# Patient Record
Sex: Male | Born: 1935 | Race: White | Hispanic: No | Marital: Married | Smoking: Former smoker
Health system: Southern US, Community
[De-identification: ages and names within clinical notes are randomized; demographics above are authoritative.]

## PROBLEM LIST (undated history)

## (undated) DIAGNOSIS — E119 Type 2 diabetes mellitus without complications: Secondary | ICD-10-CM

## (undated) HISTORY — DX: Type 2 diabetes mellitus without complications: E11.9

---

## 2003-10-10 ENCOUNTER — Encounter: Admission: RE | Admit: 2003-10-10 | Discharge: 2003-10-10 | Payer: Self-pay | Admitting: Family Medicine

## 2003-11-06 ENCOUNTER — Ambulatory Visit (HOSPITAL_COMMUNITY): Admission: RE | Admit: 2003-11-06 | Discharge: 2003-11-06 | Payer: Self-pay | Admitting: Gastroenterology

## 2005-12-22 ENCOUNTER — Encounter: Admission: RE | Admit: 2005-12-22 | Discharge: 2005-12-22 | Payer: Self-pay | Admitting: Family Medicine

## 2005-12-29 ENCOUNTER — Encounter: Admission: RE | Admit: 2005-12-29 | Discharge: 2005-12-29 | Payer: Self-pay | Admitting: Family Medicine

## 2006-01-16 ENCOUNTER — Encounter: Admission: RE | Admit: 2006-01-16 | Discharge: 2006-01-16 | Payer: Self-pay | Admitting: Family Medicine

## 2006-01-31 ENCOUNTER — Ambulatory Visit (HOSPITAL_COMMUNITY): Admission: RE | Admit: 2006-01-31 | Discharge: 2006-02-01 | Payer: Self-pay | Admitting: *Deleted

## 2006-03-01 ENCOUNTER — Ambulatory Visit (HOSPITAL_COMMUNITY): Admission: RE | Admit: 2006-03-01 | Discharge: 2006-03-02 | Payer: Self-pay | Admitting: Interventional Cardiology

## 2006-04-13 ENCOUNTER — Encounter (HOSPITAL_COMMUNITY): Admission: RE | Admit: 2006-04-13 | Discharge: 2006-07-12 | Payer: Self-pay | Admitting: Cardiology

## 2006-07-13 ENCOUNTER — Encounter (HOSPITAL_COMMUNITY): Admission: RE | Admit: 2006-07-13 | Discharge: 2006-07-14 | Payer: Self-pay | Admitting: *Deleted

## 2007-07-05 ENCOUNTER — Ambulatory Visit (HOSPITAL_COMMUNITY): Admission: RE | Admit: 2007-07-05 | Discharge: 2007-07-05 | Payer: Self-pay | Admitting: Orthopedic Surgery

## 2008-04-16 ENCOUNTER — Encounter: Admission: RE | Admit: 2008-04-16 | Discharge: 2008-04-16 | Payer: Self-pay | Admitting: Family Medicine

## 2009-09-10 ENCOUNTER — Inpatient Hospital Stay (HOSPITAL_COMMUNITY): Admission: RE | Admit: 2009-09-10 | Discharge: 2009-09-14 | Payer: Self-pay | Admitting: Orthopedic Surgery

## 2009-10-06 ENCOUNTER — Encounter: Admission: RE | Admit: 2009-10-06 | Discharge: 2009-11-10 | Payer: Self-pay | Admitting: Orthopedic Surgery

## 2010-12-20 LAB — BASIC METABOLIC PANEL
BUN: 35 mg/dL — ABNORMAL HIGH (ref 6–23)
BUN: 37 mg/dL — ABNORMAL HIGH (ref 6–23)
CO2: 24 mEq/L (ref 19–32)
Calcium: 8.8 mg/dL (ref 8.4–10.5)
Chloride: 100 mEq/L (ref 96–112)
Chloride: 99 mEq/L (ref 96–112)
Creatinine, Ser: 1.58 mg/dL — ABNORMAL HIGH (ref 0.4–1.5)
Creatinine, Ser: 1.6 mg/dL — ABNORMAL HIGH (ref 0.4–1.5)
GFR calc Af Amer: 51 mL/min — ABNORMAL LOW (ref >60)
GFR calc non Af Amer: 42 mL/min — ABNORMAL LOW (ref >60)
Glucose, Bld: 156 mg/dL — ABNORMAL HIGH (ref 70–99)
Glucose, Bld: 199 mg/dL — ABNORMAL HIGH (ref 70–99)
Potassium: 4.7 mEq/L (ref 3.5–5.1)
Sodium: 134 mEq/L — ABNORMAL LOW (ref 135–145)

## 2010-12-20 LAB — GLUCOSE, CAPILLARY
Glucose-Capillary: 128 mg/dL — ABNORMAL HIGH (ref 70–99)
Glucose-Capillary: 132 mg/dL — ABNORMAL HIGH (ref 70–99)
Glucose-Capillary: 143 mg/dL — ABNORMAL HIGH (ref 70–99)
Glucose-Capillary: 154 mg/dL — ABNORMAL HIGH (ref 70–99)
Glucose-Capillary: 159 mg/dL — ABNORMAL HIGH (ref 70–99)
Glucose-Capillary: 160 mg/dL — ABNORMAL HIGH (ref 70–99)
Glucose-Capillary: 161 mg/dL — ABNORMAL HIGH (ref 70–99)
Glucose-Capillary: 167 mg/dL — ABNORMAL HIGH (ref 70–99)
Glucose-Capillary: 175 mg/dL — ABNORMAL HIGH (ref 70–99)
Glucose-Capillary: 187 mg/dL — ABNORMAL HIGH (ref 70–99)
Glucose-Capillary: 199 mg/dL — ABNORMAL HIGH (ref 70–99)
Glucose-Capillary: 213 mg/dL — ABNORMAL HIGH (ref 70–99)
Glucose-Capillary: 261 mg/dL — ABNORMAL HIGH (ref 70–99)

## 2010-12-20 LAB — URINE CULTURE
Colony Count: NO GROWTH
Culture: NO GROWTH

## 2010-12-20 LAB — COMPREHENSIVE METABOLIC PANEL
ALT: 30 U/L (ref 0–53)
AST: 30 U/L (ref 0–37)
Albumin: 3.9 g/dL (ref 3.5–5.2)
Alkaline Phosphatase: 61 U/L (ref 39–117)
BUN: 24 mg/dL — ABNORMAL HIGH (ref 6–23)
CO2: 25 mEq/L (ref 19–32)
Calcium: 9.6 mg/dL (ref 8.4–10.5)
Chloride: 102 mEq/L (ref 96–112)
Creatinine, Ser: 1.35 mg/dL (ref 0.4–1.5)
GFR calc Af Amer: >60 mL/min (ref >60)
GFR calc non Af Amer: 52 mL/min — ABNORMAL LOW (ref >60)
Glucose, Bld: 150 mg/dL — ABNORMAL HIGH (ref 70–99)
Potassium: 4.1 mEq/L (ref 3.5–5.1)
Sodium: 136 mEq/L (ref 135–145)
Total Bilirubin: 0.8 mg/dL (ref 0.3–1.2)
Total Protein: 6.9 g/dL (ref 6.0–8.3)

## 2010-12-20 LAB — URINALYSIS, ROUTINE W REFLEX MICROSCOPIC
Bilirubin Urine: NEGATIVE
Glucose, UA: NEGATIVE mg/dL
Hgb urine dipstick: NEGATIVE
Ketones, ur: NEGATIVE mg/dL
Nitrite: NEGATIVE
Protein, ur: NEGATIVE mg/dL
Specific Gravity, Urine: 1.011 (ref 1.005–1.030)
Urobilinogen, UA: 1 mg/dL (ref 0.0–1.0)
pH: 6.5 (ref 5.0–8.0)

## 2010-12-20 LAB — CBC
HCT: 27.3 % — ABNORMAL LOW (ref 39.0–52.0)
HCT: 29.6 % — ABNORMAL LOW (ref 39.0–52.0)
HCT: 31.4 % — ABNORMAL LOW (ref 39.0–52.0)
HCT: 36.9 % — ABNORMAL LOW (ref 39.0–52.0)
Hemoglobin: 10.6 g/dL — ABNORMAL LOW (ref 13.0–17.0)
Hemoglobin: 12.7 g/dL — ABNORMAL LOW (ref 13.0–17.0)
MCHC: 33.8 g/dL (ref 30.0–36.0)
MCHC: 34.5 g/dL (ref 30.0–36.0)
MCV: 93 fL (ref 78.0–100.0)
MCV: 94 fL (ref 78.0–100.0)
MCV: 94.3 fL (ref 78.0–100.0)
Platelets: 172 10*3/uL (ref 150–400)
Platelets: 182 10*3/uL (ref 150–400)
Platelets: 200 10*3/uL (ref 150–400)
Platelets: 207 10*3/uL (ref 150–400)
RBC: 3.33 MIL/uL — ABNORMAL LOW (ref 4.22–5.81)
RBC: 3.97 MIL/uL — ABNORMAL LOW (ref 4.22–5.81)
RDW: 13.1 % (ref 11.5–15.5)
RDW: 13.2 % (ref 11.5–15.5)
RDW: 13.4 % (ref 11.5–15.5)
RDW: 13.5 % (ref 11.5–15.5)
WBC: 11.6 10*3/uL — ABNORMAL HIGH (ref 4.0–10.5)
WBC: 13.6 10*3/uL — ABNORMAL HIGH (ref 4.0–10.5)
WBC: 18.2 10*3/uL — ABNORMAL HIGH (ref 4.0–10.5)
WBC: 8.7 10*3/uL (ref 4.0–10.5)

## 2010-12-20 LAB — URINE MICROSCOPIC-ADD ON

## 2010-12-20 LAB — PROTIME-INR
INR: 1.01 (ref 0.00–1.49)
INR: 1.24 (ref 0.00–1.49)
INR: 2.63 — ABNORMAL HIGH (ref 0.00–1.49)
Prothrombin Time: 13.2 seconds (ref 11.6–15.2)
Prothrombin Time: 15.5 seconds — ABNORMAL HIGH (ref 11.6–15.2)
Prothrombin Time: 18.5 seconds — ABNORMAL HIGH (ref 11.6–15.2)
Prothrombin Time: 24 seconds — ABNORMAL HIGH (ref 11.6–15.2)
Prothrombin Time: 27.9 seconds — ABNORMAL HIGH (ref 11.6–15.2)

## 2010-12-20 LAB — APTT: aPTT: 27 seconds (ref 24–37)

## 2011-02-01 NOTE — Op Note (Signed)
NAME:  Jimmy Butler, Jimmy Butler NO.:  [id number]   MEDICAL RECORD NO.:  [id number]          PATIENT TYPE:  AMB   LOCATION:  SDS                          FACILITY:  [facility]   PHYSICIAN:  Vania Rea. Supple, M.D.  DATE OF BIRTH:  [birth year]   DATE OF PROCEDURE:  07/05/2007  DATE OF DISCHARGE:  07/05/2007                               OPERATIVE REPORT   PREOPERATIVE DIAGNOSIS:  Right medial knee pain with probable medial  meniscus tear.   POSTOPERATIVE DIAGNOSES:  1. Right knee medial meniscus tear.  2. Right knee lateral meniscus tear.  3. 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  4. Chondromalacia of the patellofemoral joint.  5. Extensive synovitis.   PROCEDURE:  1. Right knee diagnostic arthroscopy.  2. Partial medial and partial lateral meniscectomies.  3. Chondroplasty of the medial compartment.  4. Chondroplasty of the trochlear groove of the patellofemoral joint.  5. Extensive synovectomy.   SURGEON:  Vania Rea. Supple, MD   ASSISTANT:  Ralene Bathe, PA-C.   ANESTHESIA:  Local with IV sedation.   TOURNIQUET TIME:  None was used.   ESTIMATED BLOOD LOSS:  Minimal.   DRAINS:  None.   HISTORY:  Jimmy Butler is a 77 year old gentleman whose has had chronic  right knee pain with primarily medial knee tenderness and symptoms that  had been refractory to prolonged attempts at conservative management.  Due to his ongoing pain and functional limitations, he is brought to the  operating room at this time for planned right knee arthroscopy as  described below.   Preoperatively, I counseled Jimmy Butler on treatment options as well as  risks versus benefits thereof.  Possible surgical complications  including bleeding, infection, neurovascular injury, DVT, PE as well as  persistence of pain were reviewed.  He understands and accepts and  agrees with our planned procedure.   PROCEDURE IN DETAIL:  After undergoing routine preop evaluation,  the  patient received prophylactic antibiotics and a knee block anesthetic  was placed in the holding area with the anesthesia department.  He was  placed supine on the operating table with the right leg placed in a leg  holder and sterilely prepped and draped in the standard fashion.  Standard portals were established and diagnostic arthroscopy was  performed.  The suprapatellar pouch and gutter showed diffuse synovitis  and there was significant proliferative overgrowth of synovial tissues  encroaching upon the patellofemoral joint.  An extensive synovectomy was  performed.  Patellofemoral joint showed normal tracking, but there was  broad grade 3 chondromalacia over the distal aspect of the trochlear  groove and these areas were debrided with a shaver and this  chondromalacia also extended into the anterior margin of the medial  femoral condyle, which was also debrided with a shaver.  The  intercondylar notch showed diffuse synovitis and some attenuation of the  ACL, but no gross ACL laxity.  The medial compartment showed a complex  tear involving the anterior and posterior horns.  A basket was used to  trim the meniscus back to  a stable peripheral margin and this shaver was  used for final contouring and removal of the meniscal fragments.  I then  performed a chondroplasty of diffuse grade 3 chondromalacia of the  medial femoral condyle.  Laterally, the joint space was quite tight.  There was degenerative tearing of the middle and posterior thirds of the  lateral meniscus and this was trimmed back to a stable margin with a  shaver and chondroplasty was also performed of the lateral tibial  plateau.  At this point, final inspection and irrigation were completed.  All instruments were removed.  A combination of Marcaine, morphine,  epinephrine and clonidine were instilled into the joint and additional  Marcaine with epinephrine instilled about the portals.  Portals were  closed with  Steri-Strips.  A bulky dry dressing wrapped out the right  knee and the right leg was wrapped with an Ace bandage and thigh-high  support stocking.  The patient was transferred to the recovery room in  stable condition.      Vania Rea. Supple, M.D.  Electronically Signed     KMS/MEDQ  D:  07/05/2007  T:  07/06/2007  Job:  [id number]

## 2011-02-04 NOTE — Cardiovascular Report (Signed)
NAME:  Jimmy Butler, Jimmy Butler NO.:  [id number]   MEDICAL RECORD NO.:  [id number]          PATIENT TYPE:  OIB   LOCATION:  6526                         FACILITY:  [facility]   PHYSICIAN:  Corky Crafts, MDDATE OF BIRTH:  [birth year]   DATE OF PROCEDURE:  03/01/2006  DATE OF DISCHARGE:                              CARDIAC CATHETERIZATION   REFERRING PHYSICIAN:  Meade Maw, M.D.   PRIMARY PHYSICIAN:  Donia Guiles, M.D.   PROCEDURES PERFORMED:  PCI of the second obtuse marginal.   OPERATOR:  Corky Crafts, M.D.   INDICATIONS:  Coronary artery disease and shortness of breath.   PROCEDURAL NARRATIVE:  The risks and benefits of PCI were explained to the  patient and informed consent was obtained.  The patient was brought to the  cath lab and placed on the table.  He was prepped and draped in the usual  sterile fashion.  1% lidocaine was infiltrated into his right groin.  A 6-  [place] arterial sheath was placed into his right femoral artery using the  modified Seldinger technique.  Angiomax was used for anticoagulation, since  the patient had already been on Plavix.  A CLS 3.5 guiding catheter was  advanced to the ascending aorta and into the ostium of the left main under  fluoroscopic guidance.  Digital angiography was performed in multiple  projections using hand injection of contrast.  The previously placed LAD  stent was widely patent.  The second diagonal, which underwent balloon  angioplasty at the time of his last catheterization, was also widely patent  with TIMI III flow.  The PCI was then performed.  A Prowater wire was  advanced across the lesion in the OM-2.  A 2.75 mm x 13 mm Cypher stent was  advanced across the lesion and inflated to 16 atmospheres for 32 seconds.  The mid portion of the stent was then post dilated with a 3.0 x 8 mm Quantum  Maverick inflated to 14 atmospheres for 17 seconds.  It was then inflated  again to 14 atmospheres for 13  seconds.  There was an excellent angiographic  result.  There was a 0% residual stenosis and TIMI III flow was noted in the  vessel.   IMPRESSIONS:  1.  Successful Cypher stent placement to the OM-2 with a 2.75 x 13 mm stent.      This was post dilated to 3.0 in diameter.  2.  Patent left anterior descending stent, patent second diagonal.   RECOMMENDATIONS:  The patient will be watched overnight.  He should continue  his aspirin 325 mg p.o. daily and Plavix 75 mg daily.  The patient will  follow-up with Dr. Fraser Din and Dr. Arvilla Market.      Corky Crafts, MD  Electronically Signed    JSV/MEDQ  D:  03/01/2006  T:  03/01/2006  Job:  [id number]

## 2011-02-04 NOTE — Cardiovascular Report (Signed)
NAME:  Jimmy Butler, Jimmy Butler NO.:  [id number]   MEDICAL RECORD NO.:  [id number]          PATIENT TYPE:  OIB   LOCATION:  2807                         FACILITY:  [facility]   PHYSICIAN:  Corky Crafts, MDDATE OF BIRTH:  01/29/1933   DATE OF PROCEDURE:  01/31/2006  DATE OF DISCHARGE:                              CARDIAC CATHETERIZATION   REFERRING PHYSICIANS:  Dr. Meade Maw and Dr. Donia Guiles, M.D.   PROCEDURES PERFORMED:  1.  LAD stent.  2.  PTCA of the second diagonal.  3.  Cutting-balloon angioplasty of the second diagonal.   OPERATOR:  Corky Crafts, M.D.   INDICATIONS:  Abnormal stress test.   PROCEDURAL NARRATIVE:  The diagnostic catheterization was performed, by Dr.  Fraser Din, revealing significant coronary artery disease in the LAD and  circumflex distribution.  The decision was made to first proceed with the  LAD intervention, a CLS guide was used to engage the left main under  fluoroscopic guidance.  The patient was anticoagulated with heparin and  Integrilin and a 7-French system was used because of the bifurcation lesion.  A Prowater wire was then placed into the second diagonal.  A BMW wire was  then placed in the LAD.  A 2.5-mm x 6-mm cutting-balloon was advanced to the  ostium of the second diagonal and inflated to 6 atmospheres for 32 seconds.  There is an excellent angiographic result, but the significant stenosis in  the LAD remains.  A 2.75 x 18-mm CYPHER stent was then advanced across the  LAD lesion and deployed at 14 atmospheres for 43 seconds.  The LAD stent was  then post dilated with a 3.0 x 13-mm PowerSail balloon.  It was inflated to  12 atmospheres for 25 seconds in the distal stent and then 22 atmospheres  for 40 seconds in the proximal stent.  The Prowater wire was removed prior  to the post dilatation from the diagonal.  It was then re-advanced through  the stent struts back into the second diagonal.  A 2.5 x 8-mm  Voyager  balloon was then advanced to the ostium of the diagonal and inflated to 8  atmospheres for 20 seconds as there was a significant stenosis again after  the LAD stent was placed.  This stenosis in the diagonal was then relieved.  The 2.5 x 8-mm Voyager balloon was left in the diagonal and a 2.5 x 12-mm  Voyager balloon was advanced to the LAD.  The two balloons were inflated  simultaneously to 6 atmospheres each for 15 seconds.  Balloon slippage was  noted.  The LAD balloon was then pulled back slightly and a second  simultaneous kissing-balloon inflation was performed to 6 atmospheres for 33  seconds.  There was normal flow in the second diagonal with no dissection  noted.  The stent in the LAD appeared well deployed.  The stenosis in the  LAD was reduced from 80% to 0%.  The stenosis in the ostium of second  diagonal was reduced from 80% to 30%.   IMPRESSIONS:  Successful left anterior descending artery stent placement  with  percutaneous transluminal coronary angioplasty of the second diagonal  in a kissing-balloon fashion   The patient to continue with:  1.  Aspirin 325 mg p.o. every day.  2.  Plavix 75 mg p.o. q.d. indefinitely.   Will receive Integrilin for 18 hours IV.   We will plan a PCI of the OM-1 at a later time, after he is recovered from  this cardiac cath.   The patient will also follow up with Dr. Fraser Din and Dr. Donia Guiles.      Corky Crafts, MD  Electronically Signed     JSV/MEDQ  D:  01/31/2006  T:  01/31/2006  Job:  [id number]

## 2011-02-04 NOTE — Cardiovascular Report (Signed)
NAME:  Jimmy Butler, Jimmy Butler NO.:  [id number]   MEDICAL RECORD NO.:  [id number]          PATIENT TYPE:  OIB   LOCATION:  2899                         FACILITY:  [facility]   PHYSICIAN:  Meade Maw, M.D.    DATE OF BIRTH:  [birth year]   DATE OF PROCEDURE:  01/31/2006  DATE OF DISCHARGE:                              CARDIAC CATHETERIZATION   INDICATIONS FOR PROCEDURE:  And unstable angina, ST depression treadmill   PROCEDURE:  After obtaining written informed consent, the patient was  brought to the cardiac catheterization lab in a post absorptive state.  Preop sedation was achieved using IV Versed and fentanyl 100.  The right  groin was prepped and draped in usual sterile fashion.  Local anesthesia was  achieved using 1% Xylocaine. A 6-French hemostasis sheath was placed into  the right femoral artery using modified Seldinger technique.  Selective  coronary angiography was performed using a JL-4, JR-4 Judkins catheter.  Multiple views were obtained.  All catheter exchange made over a guidewire.  Single-plane ventriculogram was performed in the RAO position using a 6-  French pigtail curved catheter.  The films were reviewed with Dr. Verdis Prime and Dr. Everette Rank.  It was felt that there was significant disease  in the LAD and circumflex.  Dr. Eldridge Dace followed with an intervention on  the LAD. There were no immediate complications at the time of my completion  of the study.   FINDINGS:  Aortic pressure was 140/76' LV pressure was 140/8. EDP was 14.  Single-plane ventriculogram revealed normal wall motion, ejection fraction  of 65%.  There is post mitral regurgitation only. Fluoroscopy revealed mild  calcification of the proximal circumflex and LAD.   CORONARY ANGIOGRAPHY:  The left main coronary artery bifurcates into the  left anterior descending and circumflex.  There was no significant disease  noted in the left main coronary artery.   Left anterior descending:  Left anterior descending gives rise to a small  bifurcating diagonal-1, a larger diagonal-2, then goes on to end at the  apical branch.  The mid LAD and diagonal had ostial 80-90% lesion noted.  There was otherwise luminal irregularities in the left anterior descending.   Circumflex vessel:  Circumflex vessel gave rise to only large trifurcating  OM-1. a trivial OM-2, and an OM-3.  The first obtuse marginal had a  questionable 70-80% napkin ring lesion. The third obtuse marginal was  totally occluded and had left-to-left collaterals.   Right coronary artery: The right coronary artery is large artery, dominant  for the posterior circulation, gives rise to two RV marginals, a PDA, and a  posterolateral branch.  There were luminal irregularities in the right  coronary artery only.   IMPRESSION:  1.  Critical disease involving the mid left anterior descending and      diagonal.  2.  Questionable napkin ring lesion in the first obtuse marginal.  3.  Preserved systolic function, ejection fraction of 65%.   RECOMMENDATIONS:  Dr. Eldridge Dace will proceed with a staged procedure on the  LAD and circumflex.      Myriam Jacobson  Fraser Din, M.D.  Electronically Signed     HP/MEDQ  D:  01/31/2006  T:  01/31/2006  Job:  [id number]   cc:   Donia Guiles, M.D.  Fax: [phone]

## 2011-02-04 NOTE — Op Note (Signed)
NAME:  Jimmy Butler, Jimmy Butler NO.:  [id number]   MEDICAL RECORD NO.:  [id number]                   PATIENT TYPE:  AMB   LOCATION:  ENDO                                 FACILITY:  [facility]   PHYSICIAN:  Danise Edge, M.D.                DATE OF BIRTH:  Sep 24, 1932   DATE OF PROCEDURE:  11/06/2003  DATE OF DISCHARGE:                                 OPERATIVE REPORT   PROCEDURE:  Screening colonoscopy.   PROCEDURE INDICATION:  [name] is a 77 year old male born March 01, 1933.  [name] has intermittent left-sided abdominal pain without  gastrointestinal bleeding.  Health maintenance flexible proctosigmoidoscopy  exams performed in 1994 and February 2000 were normal.  [name] is  scheduled to undergo a diagnostic complete colonoscopy with polypectomy to  prevent colon cancer.   ENDOSCOPIST:  Danise Edge, M.D.   PREMEDICATION:  Versed 5 mg, Demerol 50 mg.   DESCRIPTION OF PROCEDURE:  After obtaining informed consent, [name] was  placed in the left lateral decubitus position.  I administered intravenous  Demerol and intravenous Versed to achieve conscious sedation for the  procedure.  The patient's blood pressure, oxygen saturation, and cardiac  rhythm were monitored throughout the procedure and documented in the medical  record.   Anal inspection was normal.  Digital rectal exam revealed a non-nodular  prostate.  The Olympus adjustable pediatric colonoscope was introduced into  the rectum and advanced to the cecum.  Colonic preparation for the exam  today was excellent.   Rectum normal.   Sigmoid colon and descending colon normal.   Splenic flexure normal.   Transverse colon normal.   Hepatic flexure normal.   Ascending colon normal.   Cecum and ileocecal valve normal.   ASSESSMENT:  Normal screening proctocolonoscopy to the cecum.  No endoscopic  evidence for the presence of colorectal neoplasia.                      Danise Edge, M.D.    MJ/MEDQ  D:  11/06/2003  T:  11/06/2003  Job:  [id number]   cc:   Donia Guiles, M.D.  [address] [place]  [place] [address]  Fax: [phone]

## 2011-02-04 NOTE — Discharge Summary (Signed)
NAME:  Jimmy Butler, Jimmy Butler NO.:  [id number]   MEDICAL RECORD NO.:  [id number]          PATIENT TYPE:  OIB   LOCATION:  6526                         FACILITY:  [facility]   PHYSICIAN:  Corky Crafts, MDDATE OF BIRTH:  1932/10/03   DATE OF ADMISSION:  03/01/2006  DATE OF DISCHARGE:  03/02/2006                                 DISCHARGE SUMMARY   DISCHARGE DIAGNOSES:  1.  Coronary artery disease status post Cypher stent placement to the 2nd      obtuse marginal artery.  2.  Dyslipidemia, treated.  3.  Hypertension, treated.  4.  Degenerative arthritis.  5.  History of cholecystectomy.  6.  Long-term medication use.   HOSPITAL COURSE:  [name] is a 77 year old male patient who had noted  increasing shortness of breath over the past year.  He underwent cardiac  catheterization on Jan 31, 2006, after having an abnormal stress test.  The  patient was found to have severe OM-2 disease, and on March 01, 2006, the  patient underwent Cypher stent placement to a 70% lesion in the OM-2.  The  patient tolerated the procedure well and was monitored in the hospital  overnight.  The patient was discharged to home the following morning, and an  appointment was made for the patient to see Dr. Fraser Din on March 20, 2006, at  9 a.m.   DISCHARGE MEDICATIONS:  1.  Enteric-coated aspirin 325 mg a day.  2.  Plavix 75 mg a day.  3.  Lisinopril/hydrochlorothiazide 20/25 mg 1 a day.  4.  Caduet 10/10 one daily.  5.  He is to resume his vitamins.  6.  Sublingual nitroglycerin p.r.n. for chest pain.   The patient is to clean the cath site with soap and water.  No scrubbing.  No lifting over 10 pounds for 1 week.  No driving for 2 days.  Remain on a  low-fat diet.  The patient is to call for any questions or concerns.      Guy Franco, P.A.      Corky Crafts, MD  Electronically Signed    LB/MEDQ  D:  03/02/2006  T:  03/02/2006  Job:  [id number]   cc:   Meade Maw, M.D.  Fax: [phone]

## 2011-02-04 NOTE — Discharge Summary (Signed)
NAME:  Jimmy, Butler NO.:  [id number]   MEDICAL RECORD NO.:  [id number]          PATIENT TYPE:  OIB   LOCATION:  6532                         FACILITY:  [facility]   PHYSICIAN:  Meade Maw, M.D.    DATE OF BIRTH:  1933-09-08   DATE OF ADMISSION:  01/31/2006  DATE OF DISCHARGE:  02/01/2006                                 DISCHARGE SUMMARY   ADMISSION DIAGNOSIS:  Abnormal stress test with ST segment depression.   DISCHARGE DIAGNOSES:  1.  Abnormal stress Cardiolite test with ST segment depression, status post      cardiac catheterization, status post stent implantation to the mid-left      anterior descending artery with percutaneous transluminal coronary      angioplasty of the second diagonal in a kissing balloon fashion.  2.  Hypertension.  3.  Hyperlipidemia.   PROCEDURES:  1.  LAD stent.  2.  PTCA of the second diagonal.  3.  Cutting balloon angioplasty of the second diagonal.   The diagnostic catheterization was performed by Dr. Meade Maw and  revealed significant coronary artery disease in the LAD and circumflex  distribution.  The left main coronary artery demonstrated no significant  disease.  The mid-LAD and diagonal had an ostial 80-90% lesion noted.  There  were otherwise luminal irregularities in the LAD.  The circumflex vessel  gave rise to only large trifurcating OM-1, a trivial OM-2 and a OM-3.  The  first OM marginal had a questionable 70-80% napkin-ring lesion.  The third  OM was totally occluded and had left to left collaterals.  The right  coronary artery is a large artery, dominant for the posterior circulation,  gave rise to two RV marginals, a PDA and a posterolateral branch.  There  were luminal irregularities in the RCA only.  A Cypher stent was advanced  across the LAD which reduced the 80% stenosis to 0%.  PTCA of the second  diagonal in a kissing balloon fashion reduced the stenosis from 80% to 30%.  The PCI of the OM-1 will be  addressed at a later date.  Aortic pressure was  140/76, LV pressure was 140/8, EDP was 14.  Single plane ventriculogram  revealed normal wall motion with an EF of 65%.  There was post mitral  regurgitation only.  Fluoroscopy revealed mild calcification of the proximal  circumflex and LAD.  The intervention was performed by Dr. Catalina Gravel.  The  procedure was well tolerated with minimal blood loss.   HOSPITAL COURSE:  Jimmy Butler presented to the [facility] Cardiology office for a  stress test per the referral of his primary care physician a week ago.  The  results of the stress Cardiolite was abnormal revealing ST segment  depression.  Jimmy Butler was scheduled for elective cardiac catheterization on  an outpatient basis at [facility] which revealed an 80-90% stenosis  in the mid-LAD and diagonal-2.  It also revealed a questionable 70-80%  napkin-ring lesion in the OM-1 as well as a totally occluded OM-3 with left  to left collaterals.  Following the diagnostic cardiac  catheterization, an  intervention was performed and a Cypher stent was implanted in the mid-LAD  reducing the 80% stenosis to 0%.  A percutaneous transluminal coronary  angioplasty of the second diagonal was performed in a kissing balloon  fashion which reduced the 80% stenosis to 30%.  PCI of the OM-1 will be  scheduled for a later date.  The patient was started on Plavix 75 mg daily  and will be continued on that drug indefinitely.  His enteric-coated aspirin  81 mg daily has been increased to 325 mg daily.  The procedure was well  tolerated with minimal blood loss.  The patient's groin was stable without  bleeding, oozing or hematoma.  The patient denied any complaints of chest  pain or shortness of breath during this admission.  He ambulated without any  symptoms of angina.  The patient is being discharged to home today in stable  condition.   LABORATORY DATA:  Sodium 134, potassium 4.9, chloride 102, CO2 24,  glucose  182, BUN 34, creatinine 1.3, calcium 9.3.  White blood count 16.5,  hemoglobin 12.9, hematocrit 38.2, platelets 252,000.   No chest x-rays were obtained during this admission.   EKG:  Jan 31, 2006, normal sinus rhythm with a ventricular rate of 60 beats  per minute without any ST or T-wave abnormalities.  EKG Feb 01, 2006  revealed sinus bradycardia with a ventricular rate of 59 beats per minute  without any ST or T-wave abnormalities.   CONDITION ON DISCHARGE:  Jimmy Butler ambulated today without any complaints of  chest pain, shortness of breath, palpitations, dizziness or syncope.  His  groin is stable without bleeding, oozing or hematoma.  He is being  discharged to home today in stable condition.   DISCHARGE MEDICATIONS:  1.  Plavix 75 mg daily.  This represents a new prescription given with      refills.  2.  Lisinopril/HCTZ 20/25 mg daily.  3.  Caduet 10/10 mg daily.  4.  Enteric-coated aspirin 325 mg daily.  This represents an increase dosage      from the previous 81 mg per day dosage.  5.  Vitamin B12 daily.  6.  Fish oil daily.  7.  Sublingual nitroglycerin 0.4 mg as needed for chest pain.  This      represents a new prescription.  A prescription was given with refills.   DISCHARGE INSTRUCTIONS:  The patient has been instructed to follow a heart  healthy diet including low-salt, low-cholesterol and low-fat.  The patient  has been instructed to avoid driving for 48 hours.  The patient is to avoid  lifting greater than 10 pounds for one week.  The patient has been referred  for cardiac rehab phases 1 and 2.   FOLLOW UP:  The patient has been scheduled for a follow-up appointment with  Dr. Meade Maw in the [facility] Cardiology office on Feb 15, 2006 at 10:15  a.m.  He is to call [phone] if he is unable to make the scheduled  appointment.      [address] [place]      Meade Maw, M.D. Electronically Signed    RDM/MEDQ  D:  02/01/2006  T:   02/01/2006  Job:  [id number]   cc:   Meade Maw, M.D.  Fax: [phone]   Donia Guiles, M.D.  Fax: [phone]

## 2011-03-17 ENCOUNTER — Encounter: Payer: Self-pay | Admitting: Podiatry

## 2011-03-17 DIAGNOSIS — E119 Type 2 diabetes mellitus without complications: Secondary | ICD-10-CM | POA: Insufficient documentation

## 2011-03-17 DIAGNOSIS — K219 Gastro-esophageal reflux disease without esophagitis: Secondary | ICD-10-CM | POA: Insufficient documentation

## 2011-03-17 DIAGNOSIS — R0689 Other abnormalities of breathing: Secondary | ICD-10-CM | POA: Insufficient documentation

## 2011-03-17 DIAGNOSIS — M1990 Unspecified osteoarthritis, unspecified site: Secondary | ICD-10-CM | POA: Insufficient documentation

## 2011-03-17 DIAGNOSIS — J45909 Unspecified asthma, uncomplicated: Secondary | ICD-10-CM | POA: Insufficient documentation

## 2011-03-17 DIAGNOSIS — G4730 Sleep apnea, unspecified: Secondary | ICD-10-CM | POA: Insufficient documentation

## 2011-06-30 LAB — CBC
MCHC: 33.9
MCV: 90.7
Platelets: 232
RBC: 4.17 — ABNORMAL LOW
WBC: 9.3

## 2011-06-30 LAB — COMPREHENSIVE METABOLIC PANEL
ALT: 20
AST: 22
Albumin: 4
Calcium: 9.8
Chloride: 101
Creatinine, Ser: 1.37
GFR calc Af Amer: >60
Sodium: 136

## 2011-06-30 LAB — DIFFERENTIAL
Eosinophils Absolute: 0.5
Eosinophils Relative: 5
Lymphocytes Relative: 28
Lymphs Abs: 2.6
Monocytes Absolute: 0.9 — ABNORMAL HIGH

## 2011-06-30 LAB — URINALYSIS, ROUTINE W REFLEX MICROSCOPIC
Bilirubin Urine: NEGATIVE
Glucose, UA: NEGATIVE
Hgb urine dipstick: NEGATIVE
Specific Gravity, Urine: 1.014

## 2013-03-28 DIAGNOSIS — B351 Tinea unguium: Secondary | ICD-10-CM | POA: Insufficient documentation

## 2013-06-10 ENCOUNTER — Encounter: Payer: Self-pay | Admitting: Podiatry

## 2013-06-10 DIAGNOSIS — B351 Tinea unguium: Secondary | ICD-10-CM

## 2013-06-20 ENCOUNTER — Ambulatory Visit: Payer: Self-pay | Admitting: Podiatry

## 2013-06-26 ENCOUNTER — Ambulatory Visit (INDEPENDENT_AMBULATORY_CARE_PROVIDER_SITE_OTHER): Payer: Medicare Other | Admitting: Podiatry

## 2013-06-26 ENCOUNTER — Encounter: Payer: Self-pay | Admitting: Podiatry

## 2013-06-26 VITALS — BP 133/82 | HR 73 | Resp 16

## 2013-06-26 DIAGNOSIS — M79609 Pain in unspecified limb: Secondary | ICD-10-CM

## 2013-06-26 DIAGNOSIS — B351 Tinea unguium: Secondary | ICD-10-CM

## 2013-06-27 NOTE — Progress Notes (Signed)
Subjective:     Patient ID: Jimmy Butler, male   DOB: [birth year], 79 y.o.   MRN: [id number]  HPI patient complains that his nails are bothering him and he cannot cut them.   Review of Systems  All other systems reviewed and are negative.       Objective:   Physical Exam  Nursing note and vitals reviewed. Cardiovascular: Intact distal pulses.   Neurological: He is alert.  Skin: Skin is warm.   Nails are thickened 1-5 bilateral.    Assessment:     Mycotic nail infection with pain 1-5 bilateral.    Plan:     Cutting of toenails 1-5 bilateral. No iatrogenic bleeding noted

## 2013-09-26 ENCOUNTER — Encounter: Payer: Self-pay | Admitting: Podiatry

## 2013-09-26 ENCOUNTER — Ambulatory Visit (INDEPENDENT_AMBULATORY_CARE_PROVIDER_SITE_OTHER): Payer: Medicare Other | Admitting: Podiatry

## 2013-09-26 VITALS — BP 148/73 | HR 80 | Resp 16

## 2013-09-26 DIAGNOSIS — M79609 Pain in unspecified limb: Secondary | ICD-10-CM

## 2013-09-26 DIAGNOSIS — B351 Tinea unguium: Secondary | ICD-10-CM

## 2013-09-26 NOTE — Progress Notes (Signed)
Subjective:     Patient ID: Jimmy Butler, male   DOB: [birth year], 80 y.o.   MRN: [id number]  HPI patient is found to have thick painful nailbeds 1-5 both feet that are impossible for him to reach were cut   Review of Systems     Objective:   Physical Exam Neurovascular status intact with thick nailbeds and pain 1-5 both feet    Assessment:     Mycotic nail infection with pain 1-5 both feet    Plan:     Debridement of painful nailbeds 1-5 both feet

## 2013-09-26 NOTE — Patient Instructions (Signed)
Diabetes and Foot Care Diabetes may cause you to have problems because of poor blood supply (circulation) to your feet and legs. This may cause the skin on your feet to become thinner, break easier, and heal more slowly. Your skin may become dry, and the skin may peel and crack. You may also have nerve damage in your legs and feet causing decreased feeling in them. You may not notice minor injuries to your feet that could lead to infections or more serious problems. Taking care of your feet is one of the most important things you can do for yourself.  HOME CARE INSTRUCTIONS  Wear shoes at all times, even in the house. Do not go barefoot. Bare feet are easily injured.  Check your feet daily for blisters, cuts, and redness. If you cannot see the bottom of your feet, use a mirror or ask someone for help.  Wash your feet with warm water (do not use hot water) and mild soap. Then pat your feet and the areas between your toes until they are completely dry. Do not soak your feet as this can dry your skin.  Apply a moisturizing lotion or petroleum jelly (that does not contain alcohol and is unscented) to the skin on your feet and to dry, brittle toenails. Do not apply lotion between your toes.  Trim your toenails straight across. Do not dig under them or around the cuticle. File the edges of your nails with an emery board or nail file.  Do not cut corns or calluses or try to remove them with medicine.  Wear clean socks or stockings every day. Make sure they are not too tight. Do not wear knee-high stockings since they may decrease blood flow to your legs.  Wear shoes that fit properly and have enough cushioning. To break in new shoes, wear them for just a few hours a day. This prevents you from injuring your feet. Always look in your shoes before you put them on to be sure there are no objects inside.  Do not cross your legs. This may decrease the blood flow to your feet.  If you find a minor scrape,  cut, or break in the skin on your feet, keep it and the skin around it clean and dry. These areas may be cleansed with mild soap and water. Do not cleanse the area with peroxide, alcohol, or iodine.  When you remove an adhesive bandage, be sure not to damage the skin around it.  If you have a wound, look at it several times a day to make sure it is healing.  Do not use heating pads or hot water bottles. They may burn your skin. If you have lost feeling in your feet or legs, you may not know it is happening until it is too late.  Make sure your health care provider performs a complete foot exam at least annually or more often if you have foot problems. Report any cuts, sores, or bruises to your health care provider immediately. SEEK MEDICAL CARE IF:   You have an injury that is not healing.  You have cuts or breaks in the skin.  You have an ingrown nail.  You notice redness on your legs or feet.  You feel burning or tingling in your legs or feet.  You have pain or cramps in your legs and feet.  Your legs or feet are numb.  Your feet always feel cold. SEEK IMMEDIATE MEDICAL CARE IF:   There is increasing redness,   swelling, or pain in or around a wound.  There is a red line that goes up your leg.  Pus is coming from a wound.  You develop a fever or as directed by your health care provider.  You notice a bad smell coming from an ulcer or wound. Document Released: 09/02/2000 Document Revised: 05/08/2013 Document Reviewed: 02/12/2013 [employer] Patient Information 2014 [employer].  

## 2013-12-19 ENCOUNTER — Ambulatory Visit: Payer: Medicare Other | Admitting: Podiatry

## 2014-02-21 ENCOUNTER — Ambulatory Visit (INDEPENDENT_AMBULATORY_CARE_PROVIDER_SITE_OTHER): Payer: Medicare Other | Admitting: Interventional Cardiology

## 2014-02-21 ENCOUNTER — Encounter: Payer: Self-pay | Admitting: Interventional Cardiology

## 2014-02-21 VITALS — BP 180/60 | HR 81 | Ht 67.0 in | Wt 272.0 lb

## 2014-02-21 DIAGNOSIS — R0602 Shortness of breath: Secondary | ICD-10-CM

## 2014-02-21 DIAGNOSIS — I2510 Atherosclerotic heart disease of native coronary artery without angina pectoris: Secondary | ICD-10-CM

## 2014-02-21 DIAGNOSIS — E782 Mixed hyperlipidemia: Secondary | ICD-10-CM

## 2014-02-21 DIAGNOSIS — I10 Essential (primary) hypertension: Secondary | ICD-10-CM | POA: Insufficient documentation

## 2014-02-21 LAB — BASIC METABOLIC PANEL
BUN: 31 mg/dL — AB (ref 6–23)
CALCIUM: 9.3 mg/dL (ref 8.4–10.5)
CO2: 23 mEq/L (ref 19–32)
CREATININE: 1.46 mg/dL — AB (ref 0.50–1.35)
Chloride: 103 mEq/L (ref 96–112)
Glucose, Bld: 90 mg/dL (ref 70–99)
POTASSIUM: 4.7 meq/L (ref 3.5–5.3)
Sodium: 136 mEq/L (ref 135–145)

## 2014-02-21 LAB — BRAIN NATRIURETIC PEPTIDE: Brain Natriuretic Peptide: 97.4 pg/mL (ref 0.0–100.0)

## 2014-02-21 NOTE — Patient Instructions (Signed)
Your physician recommends that you return for lab work today for bnp and bmet.  Your physician recommends that you schedule a follow-up appointment in: 2 months with Dr. Irish Lack.

## 2014-02-21 NOTE — Progress Notes (Signed)
Patient ID: Jimmy Butler, male   DOB: 09/20/1932, 80 y.o.   MRN: [id number]    [address] [place], [place]  [address] Phone: [phone] Fax:  [phone]  Date:  02/21/2014   ID:  Jimmy Butler, DOB 04/19/1933, MRN [id number]  PCP:  GATES,ROBERT NEVILL, MD      History of Present Illness: Jimmy Butler is a 80 y.o. male who has multivessel stents for CAD, placed in 2007 (LAD, left circ). He has not been seen for several years.   He is losing his eyesight.   CAD/ASCVD:  c/o Dyspnea on exertion worse.  Denies : Exercise.  Fatigue.  Leg edema.  Nitroglycerin.  Orthopnea.  Palpitations.  Syncope.    Still feels SHOB mostly with exertion. Denies dizziness, chest pain, orthopnea, PND, or LE swelling. Doesn't check BP at home secondary to vision problems.  Cannot read or write. Doesn't exercise like he should. Gets SHOB just by waking from one room to another or walking up the ramp. He had a mildly abnormal Cardiolite in 2012. This was managed medically.   Wt Readings from Last 3 Encounters:  02/21/14 272 lb (123.378 kg)     Past Medical History  Diagnosis Date  . Diabetes mellitus without complication     Current Outpatient Prescriptions  Medication Sig Dispense Refill  . amlodipine-atorvastatin (CADUET) 10-10 MG per tablet Take 1 tablet by mouth daily.        . aspirin 325 MG tablet Take 325 mg by mouth daily.        . clopidogrel (PLAVIX) 75 MG tablet Take 75 mg by mouth daily.        . lisinopril-hydrochlorothiazide (PRINZIDE,ZESTORETIC) 20-12.5 MG per tablet Take 1 tablet by mouth daily.        . metoprolol (TOPROL XL) 50 MG 24 hr tablet Take 50 mg by mouth daily.        . METOPROLOL SUCCINATE PO Take by mouth.        . Misc Natural Products (GLUCOSAMINE CHONDROITIN ADV PO) Take by mouth 3 (three) times daily.        . Multiple Vitamins-Minerals (EYE VITAMINS PO) Take by mouth.        . Omega-3 Fatty Acids (FISH OIL) 1000 MG CAPS Take 1,000 mg by  mouth 2 (two) times daily.        . vitamin B-12 (CYANOCOBALAMIN) 1000 MCG tablet Take 1,000 mcg by mouth daily.         No current facility-administered medications for this visit.    Allergies:   No Known Allergies  Social History:  The patient  reports that he quit smoking about 45 years ago. He does not have any smokeless tobacco history on file. He reports that he does not drink alcohol or use illicit drugs.   Family History:  The patient's family history includes Heart disease in his father.   ROS:  Please see the history of present illness.  No nausea, vomiting.  No fevers, chills.  No focal weakness.  No dysuria. SHOB.  All other systems reviewed and negative.   PHYSICAL EXAM: VS:  BP 180/60  Pulse 81  Ht 5' 7" (1.702 m)  Wt 272 lb (123.378 kg)  BMI 42.59 kg/m2 Well nourished, well developed, in no acute distress HEENT: normal Neck: no JVD, no carotid bruits Cardiac:  normal S1, S2; RRR; 2/6 systolic murmur Lungs:  clear to auscultation bilaterally, no wheezing, rhonchi or rales Abd: soft,   nontender, no hepatomegaly Ext: no edema Skin: warm and dry Neuro:   no focal abnormalities noted  EKG:    NSR, PACs, NSST   ASSESSMENT AND PLAN:  1. Shortness of breath:  Check BNP and be met. If fluid level elevated, consider echo versus repeat cath given Cardiolite result from 2012 showing basal to mid inferolateral ischemia. 2. Coronary atherosclerosis of native coronary artery  SHOB could be anginal equivalent. lexiscan cardiolite in 2012 as above. Watch BP as well. Check at home if possible.  BP elevated today.   3. Pure hypercholesterolemia  Continue Crestor Tablet, 10 MG, 1/2 tablet, Orally, Once a day Continue Fish Oil Capsule, 1000 MG, 3 tablets, Orally, bid ; 4/15 triglycerides 240, HDL 25, LDL 25  4. Obesity, unspecified  Watch diet. Portion control important for him.    Signed, Mina Marble, MD, [facility] 02/21/2014 5:16 PM

## 2014-03-20 NOTE — Addendum Note (Signed)
Addended byUlla Potash H on: 03/20/2014 02:15 PM   Modules accepted: Orders

## 2014-05-13 ENCOUNTER — Encounter: Payer: Self-pay | Admitting: Interventional Cardiology

## 2014-05-13 ENCOUNTER — Ambulatory Visit (INDEPENDENT_AMBULATORY_CARE_PROVIDER_SITE_OTHER): Payer: Medicare Other | Admitting: Interventional Cardiology

## 2014-05-13 VITALS — BP 142/70 | HR 89 | Ht 67.0 in | Wt 266.0 lb

## 2014-05-13 DIAGNOSIS — I10 Essential (primary) hypertension: Secondary | ICD-10-CM

## 2014-05-13 DIAGNOSIS — E782 Mixed hyperlipidemia: Secondary | ICD-10-CM

## 2014-05-13 DIAGNOSIS — I2510 Atherosclerotic heart disease of native coronary artery without angina pectoris: Secondary | ICD-10-CM

## 2014-05-13 NOTE — Progress Notes (Signed)
Patient ID: Jimmy Butler, male   DOB: [birth year], 80 y.o.   MRN: [id number] Patient ID: Jimmy Butler, male   DOB: 05-Jul-1933, 80 y.o.   MRN: [id number]    [address] [place], [place]  [address] Phone: [phone] Fax:  [phone]  Date:  05/13/2014   ID:  Jimmy Butler, DOB 07/28/1933, MRN [id number]  PCP:  Henrine Screws, MD      History of Present Illness: Jimmy Butler is a 80 y.o. male who has multivessel stents for CAD, placed in 2007 (LAD, left circ). He has not been seen for several years.   He is losing his eyesight.   CAD/ASCVD:  c/o Dyspnea on exertion worse.  Denies : Exercise.  Fatigue.  Leg edema.  Nitroglycerin.  Orthopnea.  Palpitations.  Syncope.     SHOB mostly with exertion, improved since last visit in 6/15. Denies dizziness, chest pain, orthopnea, PND, or LE swelling. Doesn't check BP at home secondary to vision problems.  Cannot read or write. Doesn't exercise like he should. No longer Gets SHOB just by waking from one room to another or walking up the ramp. He had a mildly abnormal Cardiolite in 2012. This was managed medically.   He has DOE with long distance walking.   Wt Readings from Last 3 Encounters:  05/13/14 266 lb (120.657 kg)  02/21/14 272 lb (123.378 kg)     Past Medical History  Diagnosis Date  . Diabetes mellitus without complication     Current Outpatient Prescriptions  Medication Sig Dispense Refill  . aspirin 325 MG tablet Take 325 mg by mouth daily.        . clopidogrel (PLAVIX) 75 MG tablet Take 75 mg by mouth daily.        Marland Kitchen lisinopril-hydrochlorothiazide (PRINZIDE,ZESTORETIC) 20-12.5 MG per tablet Take 1 tablet by mouth daily.        . metoprolol tartrate (LOPRESSOR) 25 MG tablet Take 25 mg by mouth 2 (two) times daily.      . Misc Natural Products (GLUCOSAMINE CHONDROITIN ADV PO) Take 1 tablet by mouth 2 (two) times daily.       . Multiple Vitamins-Minerals (EYE VITAMINS PO) Take 1 tablet by mouth  daily.       . rosuvastatin (CRESTOR) 10 MG tablet Take 5 mg by mouth daily.      . vitamin B-12 (CYANOCOBALAMIN) 1000 MCG tablet Take 1,000 mcg by mouth daily.         No current facility-administered medications for this visit.    Allergies:   No Known Allergies  Social History:  The patient  reports that he quit smoking about 45 years ago. He does not have any smokeless tobacco history on file. He reports that he does not drink alcohol or use illicit drugs.   Family History:  The patient's family history includes Heart disease in his father.   ROS:  Please see the history of present illness.  No nausea, vomiting.  No fevers, chills.  No focal weakness.  No dysuria. SHOB.  All other systems reviewed and negative.   PHYSICAL EXAM: VS:  BP 142/70  Pulse 89  Ht 5\' 7"  (1.702 m)  Wt 266 lb (120.657 kg)  BMI 41.65 kg/m2 Well nourished, well developed, in no acute distress HEENT: normal Neck: no JVD, no carotid bruits Cardiac:  normal S1, S2; RRR; 2/6 systolic murmur Lungs:  clear to auscultation bilaterally, no wheezing, rhonchi or rales Abd: soft, nontender, no  hepatomegaly Ext: no edema Skin: warm and dry Neuro:   no focal abnormalities noted  EKG:    NSR, PACs, NSST   ASSESSMENT AND PLAN:  1. Shortness of breath:  Normal  BNP in 6/15.SHOB improved.  No plan for cath. If fluid level elevated, consider echo versus repeat cath given Cardiolite result from 2012 showing basal to mid inferolateral ischemia. 2. Coronary atherosclerosis of native coronary artery  SHOB could be anginal equivalent. lexiscan cardiolite in 2012 as above. Watch BP as well. Check at home if possible.  BP elevated today.  Sx controled.  No cath unless sx worsen. 3. Pure hypercholesterolemia  Continue Crestor Tablet, 10 MG, 1/2 tablet, Orally, Once a day Continue Fish Oil Capsule, 1000 MG, 3 tablets, Orally, bid ; 4/15 triglycerides 240, HDL 25, LDL 25  4. Obesity, unspecified  Watch diet. Portion control  important for him.    Signed, Mina Marble, MD, [facility] 05/13/2014 3:43 PM

## 2014-05-13 NOTE — Patient Instructions (Signed)
Your physician recommends that you continue on your current medications as directed. Please refer to the Current Medication list given to you today.  Your physician wants you to follow-up in: 1 year with Dr. Varanasi. You will receive a reminder letter in the mail two months in advance. If you don't receive a letter, please call our office to schedule the follow-up appointment.  

## 2015-01-12 ENCOUNTER — Ambulatory Visit
Admission: RE | Admit: 2015-01-12 | Discharge: 2015-01-12 | Disposition: A | Discharge status: Home / Self Care | Payer: Medicare Other | Source: Ambulatory Visit | Attending: Internal Medicine | Admitting: Internal Medicine

## 2015-01-12 ENCOUNTER — Other Ambulatory Visit: Payer: Self-pay | Admitting: Internal Medicine

## 2015-01-12 DIAGNOSIS — R0600 Dyspnea, unspecified: Secondary | ICD-10-CM

## 2015-01-12 DIAGNOSIS — R0609 Other forms of dyspnea: Principal | ICD-10-CM

## 2015-10-23 DIAGNOSIS — Z7984 Long term (current) use of oral hypoglycemic drugs: Secondary | ICD-10-CM | POA: Diagnosis not present

## 2015-10-23 DIAGNOSIS — E1122 Type 2 diabetes mellitus with diabetic chronic kidney disease: Secondary | ICD-10-CM | POA: Diagnosis not present

## 2015-10-23 DIAGNOSIS — I129 Hypertensive chronic kidney disease with stage 1 through stage 4 chronic kidney disease, or unspecified chronic kidney disease: Secondary | ICD-10-CM | POA: Diagnosis not present

## 2015-10-23 DIAGNOSIS — E1142 Type 2 diabetes mellitus with diabetic polyneuropathy: Secondary | ICD-10-CM | POA: Diagnosis not present

## 2015-10-23 DIAGNOSIS — Z23 Encounter for immunization: Secondary | ICD-10-CM | POA: Diagnosis not present

## 2015-10-23 DIAGNOSIS — N183 Chronic kidney disease, stage 3 (moderate): Secondary | ICD-10-CM | POA: Diagnosis not present

## 2015-11-06 ENCOUNTER — Encounter: Payer: Self-pay | Admitting: Sports Medicine

## 2015-11-06 ENCOUNTER — Ambulatory Visit (INDEPENDENT_AMBULATORY_CARE_PROVIDER_SITE_OTHER): Payer: PPO | Admitting: Sports Medicine

## 2015-11-06 DIAGNOSIS — E1142 Type 2 diabetes mellitus with diabetic polyneuropathy: Secondary | ICD-10-CM

## 2015-11-06 DIAGNOSIS — L84 Corns and callosities: Secondary | ICD-10-CM | POA: Diagnosis not present

## 2015-11-06 NOTE — Patient Instructions (Signed)
Diabetes and Foot Care Diabetes may cause you to have problems because of poor blood supply (circulation) to your feet and legs. This may cause the skin on your feet to become thinner, break easier, and heal more slowly. Your skin may become dry, and the skin may peel and crack. You may also have nerve damage in your legs and feet causing decreased feeling in them. You may not notice minor injuries to your feet that could lead to infections or more serious problems. Taking care of your feet is one of the most important things you can do for yourself.  HOME CARE INSTRUCTIONS  Wear shoes at all times, even in the house. Do not go barefoot. Bare feet are easily injured.  Check your feet daily for blisters, cuts, and redness. If you cannot see the bottom of your feet, use a mirror or ask someone for help.  Wash your feet with warm water (do not use hot water) and mild soap. Then pat your feet and the areas between your toes until they are completely dry. Do not soak your feet as this can dry your skin.  Apply a moisturizing lotion or petroleum jelly (that does not contain alcohol and is unscented) to the skin on your feet and to dry, brittle toenails. Do not apply lotion between your toes.  Trim your toenails straight across. Do not dig under them or around the cuticle. File the edges of your nails with an emery board or nail file.  Do not cut corns or calluses or try to remove them with medicine.  Wear clean socks or stockings every day. Make sure they are not too tight. Do not wear knee-high stockings since they may decrease blood flow to your legs.  Wear shoes that fit properly and have enough cushioning. To break in new shoes, wear them for just a few hours a day. This prevents you from injuring your feet. Always look in your shoes before you put them on to be sure there are no objects inside.  Do not cross your legs. This may decrease the blood flow to your feet.  If you find a minor scrape,  cut, or break in the skin on your feet, keep it and the skin around it clean and dry. These areas may be cleansed with mild soap and water. Do not cleanse the area with peroxide, alcohol, or iodine.  When you remove an adhesive bandage, be sure not to damage the skin around it.  If you have a wound, look at it several times a day to make sure it is healing.  Do not use heating pads or hot water bottles. They may burn your skin. If you have lost feeling in your feet or legs, you may not know it is happening until it is too late.  Make sure your health care provider performs a complete foot exam at least annually or more often if you have foot problems. Report any cuts, sores, or bruises to your health care provider immediately. SEEK MEDICAL CARE IF:   You have an injury that is not healing.  You have cuts or breaks in the skin.  You have an ingrown nail.  You notice redness on your legs or feet.  You feel burning or tingling in your legs or feet.  You have pain or cramps in your legs and feet.  Your legs or feet are numb.  Your feet always feel cold. SEEK IMMEDIATE MEDICAL CARE IF:   There is increasing redness,   swelling, or pain in or around a wound.  There is a red line that goes up your leg.  Pus is coming from a wound.  You develop a fever or as directed by your health care provider.  You notice a bad smell coming from an ulcer or wound.   This information is not intended to replace advice given to you by your health care provider. Make sure you discuss any questions you have with your health care provider.   Document Released: 09/02/2000 Document Revised: 05/08/2013 Document Reviewed: 02/12/2013 Elsevier Interactive Patient Education 2016 [employer]  

## 2015-11-06 NOTE — Progress Notes (Signed)
Patient ID: Jimmy Butler, male   DOB: 06-09-1933, 82 y.o.   MRN: [id number] Subjective: Jimmy Butler is a 82 y.o. male patient with history of type 2 diabetes who presents to office for diabetic shoes; states that needs new shoes. Patient denies any new changes in medication or new problems. Patient denies any new cramping, numbness, burning or tingling in the legs.  FBS note recorded  Patient Active Problem List   Diagnosis Date Noted  . Coronary atherosclerosis of native coronary artery 02/21/2014  . Essential hypertension, benign 02/21/2014  . Mixed hyperlipidemia 02/21/2014  . Mycotic toenails 03/28/2013  . Diabetes mellitus 03/17/2011  . Osteoarthritis 03/17/2011  . Asthma 03/17/2011  . Difficulty breathing 03/17/2011  . Sleep apnea 03/17/2011  . GERD (gastroesophageal reflux disease) 03/17/2011   Current Outpatient Prescriptions on File Prior to Visit  Medication Sig Dispense Refill  . aspirin 325 MG tablet Take 325 mg by mouth daily.      . clopidogrel (PLAVIX) 75 MG tablet Take 75 mg by mouth daily.      Marland Kitchen lisinopril-hydrochlorothiazide (PRINZIDE,ZESTORETIC) 20-12.5 MG per tablet Take 1 tablet by mouth daily.      . metoprolol tartrate (LOPRESSOR) 25 MG tablet Take 25 mg by mouth 2 (two) times daily.    . Misc Natural Products (GLUCOSAMINE CHONDROITIN ADV PO) Take 1 tablet by mouth 2 (two) times daily.     . Multiple Vitamins-Minerals (EYE VITAMINS PO) Take 1 tablet by mouth daily.     . rosuvastatin (CRESTOR) 10 MG tablet Take 5 mg by mouth daily.    . vitamin B-12 (CYANOCOBALAMIN) 1000 MCG tablet Take 1,000 mcg by mouth daily.       No current facility-administered medications on file prior to visit.   No Known Allergies   Objective: General: Patient is awake, alert, and oriented x 3 and in no acute distress.  Integument: Skin is warm, dry and supple bilateral. Nails are short, thickened and  dystrophic with subungual debris, consistent with onychomycosis, 1-5  bilateral,  Pre-Ulcerative callus, right submet 1 and medial hallux with no signs of infection.. No open lesions present bilateral. Remaining integument unremarkable.  Vasculature:  Dorsalis Pedis pulse 2/4 bilateral. Posterior Tibial pulse 1/4 bilateral.  Capillary fill time <3 sec 1-5 bilateral. Scant hair growth to the level of the digits. Temperature gradient within normal limits. No varicosities present bilateral. No edema present bilateral.   Neurology: The patient has absent sensation measured with a 5.07/10g Semmes Weinstein Monofilament at all pedal sites bilateral . Vibratory sensation diminished bilateral with tuning fork. No Babinski sign present bilateral.   Musculoskeletal: No gross pedal deformities noted bilateral. Muscular strength 5/5 in all lower extremity muscular groups bilateral without pain or limitation on range of motion . No tenderness with calf compression bilateral.  Assessment and Plan: Problem List Items Addressed This Visit    None    Visit Diagnoses    Diabetic polyneuropathy associated with type 2 diabetes mellitus ([place])    -  Primary    Pre-ulcerative calluses          -Examined patient. -Discussed and educated patient on diabetic foot care, especially with  regards to the vascular, neurological and musculoskeletal systems.  -Stressed the importance of good glycemic control and the detriment of not  controlling glucose levels in relation to the foot. [employer] step diabetic shoe order form was completed; office to contact primary care for approval / certification;  Office to arrange shoe fitting and dispensing. -Patient  advised to call the office if any problems or questions arise in the  Meantime.  Jimmy Butler, DPM

## 2015-11-11 ENCOUNTER — Ambulatory Visit: Payer: Self-pay | Admitting: Podiatry

## 2015-11-12 DIAGNOSIS — H353221 Exudative age-related macular degeneration, left eye, with active choroidal neovascularization: Secondary | ICD-10-CM | POA: Diagnosis not present

## 2015-11-12 DIAGNOSIS — H353134 Nonexudative age-related macular degeneration, bilateral, advanced atrophic with subfoveal involvement: Secondary | ICD-10-CM | POA: Diagnosis not present

## 2015-11-12 DIAGNOSIS — H3562 Retinal hemorrhage, left eye: Secondary | ICD-10-CM | POA: Diagnosis not present

## 2015-11-12 DIAGNOSIS — H2512 Age-related nuclear cataract, left eye: Secondary | ICD-10-CM | POA: Diagnosis not present

## 2015-11-12 DIAGNOSIS — H353213 Exudative age-related macular degeneration, right eye, with inactive scar: Secondary | ICD-10-CM | POA: Diagnosis not present

## 2015-12-17 DIAGNOSIS — H353221 Exudative age-related macular degeneration, left eye, with active choroidal neovascularization: Secondary | ICD-10-CM | POA: Diagnosis not present

## 2015-12-23 ENCOUNTER — Ambulatory Visit (INDEPENDENT_AMBULATORY_CARE_PROVIDER_SITE_OTHER): Payer: PPO | Admitting: Sports Medicine

## 2015-12-23 ENCOUNTER — Ambulatory Visit: Payer: PPO

## 2015-12-23 DIAGNOSIS — E1142 Type 2 diabetes mellitus with diabetic polyneuropathy: Secondary | ICD-10-CM

## 2015-12-23 DIAGNOSIS — L84 Corns and callosities: Secondary | ICD-10-CM

## 2015-12-24 NOTE — Progress Notes (Signed)
Patient was measured for diabetic shoes and insoles today. Will contact patient once they come in.  Patient discussed with medical assistant. Agree with above. Patient to follow up as scheduled for continued care or sooner if problems or issues arise. -Dr. Cannon Kettle

## 2015-12-29 DIAGNOSIS — G4733 Obstructive sleep apnea (adult) (pediatric): Secondary | ICD-10-CM | POA: Diagnosis not present

## 2016-01-20 ENCOUNTER — Ambulatory Visit (INDEPENDENT_AMBULATORY_CARE_PROVIDER_SITE_OTHER): Payer: PPO | Admitting: Sports Medicine

## 2016-01-20 DIAGNOSIS — L84 Corns and callosities: Secondary | ICD-10-CM

## 2016-01-20 DIAGNOSIS — E1142 Type 2 diabetes mellitus with diabetic polyneuropathy: Secondary | ICD-10-CM

## 2016-01-26 ENCOUNTER — Encounter: Payer: Self-pay | Admitting: Sports Medicine

## 2016-01-26 NOTE — Progress Notes (Signed)
Dispensed diabetic shoes and 3 pairs of insoles. Instructions were reviewed and a copy was given to the patient. Patient to reappointment for regularly scheduled diabetic foot care visits or if she experiences any trouble with her diabetic shoes.  Patient discussed with medical assistant. Agree with above. Patient to follow up as scheduled for continued care or sooner if problems or issues arise. -Dr. Cannon Kettle

## 2016-01-28 DIAGNOSIS — H353221 Exudative age-related macular degeneration, left eye, with active choroidal neovascularization: Secondary | ICD-10-CM | POA: Diagnosis not present

## 2016-02-26 DIAGNOSIS — E7800 Pure hypercholesterolemia, unspecified: Secondary | ICD-10-CM | POA: Diagnosis not present

## 2016-02-26 DIAGNOSIS — I129 Hypertensive chronic kidney disease with stage 1 through stage 4 chronic kidney disease, or unspecified chronic kidney disease: Secondary | ICD-10-CM | POA: Diagnosis not present

## 2016-02-26 DIAGNOSIS — E1122 Type 2 diabetes mellitus with diabetic chronic kidney disease: Secondary | ICD-10-CM | POA: Diagnosis not present

## 2016-02-26 DIAGNOSIS — N183 Chronic kidney disease, stage 3 (moderate): Secondary | ICD-10-CM | POA: Diagnosis not present

## 2016-02-26 DIAGNOSIS — Z0000 Encounter for general adult medical examination without abnormal findings: Secondary | ICD-10-CM | POA: Diagnosis not present

## 2016-02-26 DIAGNOSIS — Z7984 Long term (current) use of oral hypoglycemic drugs: Secondary | ICD-10-CM | POA: Diagnosis not present

## 2016-02-26 DIAGNOSIS — Z6839 Body mass index (BMI) 39.0-39.9, adult: Secondary | ICD-10-CM | POA: Diagnosis not present

## 2016-02-26 DIAGNOSIS — E1142 Type 2 diabetes mellitus with diabetic polyneuropathy: Secondary | ICD-10-CM | POA: Diagnosis not present

## 2016-02-26 DIAGNOSIS — Z1389 Encounter for screening for other disorder: Secondary | ICD-10-CM | POA: Diagnosis not present

## 2016-03-08 ENCOUNTER — Ambulatory Visit: Payer: PPO | Admitting: Sports Medicine

## 2016-03-17 DIAGNOSIS — H353221 Exudative age-related macular degeneration, left eye, with active choroidal neovascularization: Secondary | ICD-10-CM | POA: Diagnosis not present

## 2016-04-01 DIAGNOSIS — G4733 Obstructive sleep apnea (adult) (pediatric): Secondary | ICD-10-CM | POA: Diagnosis not present

## 2016-05-18 DIAGNOSIS — H353221 Exudative age-related macular degeneration, left eye, with active choroidal neovascularization: Secondary | ICD-10-CM | POA: Diagnosis not present

## 2016-07-04 DIAGNOSIS — G4733 Obstructive sleep apnea (adult) (pediatric): Secondary | ICD-10-CM | POA: Diagnosis not present

## 2016-07-27 DIAGNOSIS — H353221 Exudative age-related macular degeneration, left eye, with active choroidal neovascularization: Secondary | ICD-10-CM | POA: Diagnosis not present

## 2016-07-29 DIAGNOSIS — Z7984 Long term (current) use of oral hypoglycemic drugs: Secondary | ICD-10-CM | POA: Diagnosis not present

## 2016-07-29 DIAGNOSIS — E1122 Type 2 diabetes mellitus with diabetic chronic kidney disease: Secondary | ICD-10-CM | POA: Diagnosis not present

## 2016-07-29 DIAGNOSIS — Z23 Encounter for immunization: Secondary | ICD-10-CM | POA: Diagnosis not present

## 2016-07-29 DIAGNOSIS — I129 Hypertensive chronic kidney disease with stage 1 through stage 4 chronic kidney disease, or unspecified chronic kidney disease: Secondary | ICD-10-CM | POA: Diagnosis not present

## 2016-07-29 DIAGNOSIS — N183 Chronic kidney disease, stage 3 (moderate): Secondary | ICD-10-CM | POA: Diagnosis not present

## 2016-10-04 DIAGNOSIS — G4733 Obstructive sleep apnea (adult) (pediatric): Secondary | ICD-10-CM | POA: Diagnosis not present

## 2016-10-26 DIAGNOSIS — H353221 Exudative age-related macular degeneration, left eye, with active choroidal neovascularization: Secondary | ICD-10-CM | POA: Diagnosis not present

## 2016-11-10 DIAGNOSIS — N183 Chronic kidney disease, stage 3 (moderate): Secondary | ICD-10-CM | POA: Diagnosis not present

## 2016-11-10 DIAGNOSIS — Z7984 Long term (current) use of oral hypoglycemic drugs: Secondary | ICD-10-CM | POA: Diagnosis not present

## 2016-11-10 DIAGNOSIS — E119 Type 2 diabetes mellitus without complications: Secondary | ICD-10-CM | POA: Diagnosis not present

## 2016-11-10 DIAGNOSIS — I129 Hypertensive chronic kidney disease with stage 1 through stage 4 chronic kidney disease, or unspecified chronic kidney disease: Secondary | ICD-10-CM | POA: Diagnosis not present

## 2016-11-10 DIAGNOSIS — E1142 Type 2 diabetes mellitus with diabetic polyneuropathy: Secondary | ICD-10-CM | POA: Diagnosis not present

## 2016-11-10 DIAGNOSIS — E1122 Type 2 diabetes mellitus with diabetic chronic kidney disease: Secondary | ICD-10-CM | POA: Diagnosis not present

## 2017-01-04 DIAGNOSIS — G4733 Obstructive sleep apnea (adult) (pediatric): Secondary | ICD-10-CM | POA: Diagnosis not present

## 2017-01-25 DIAGNOSIS — H353221 Exudative age-related macular degeneration, left eye, with active choroidal neovascularization: Secondary | ICD-10-CM | POA: Diagnosis not present

## 2017-01-25 DIAGNOSIS — H353134 Nonexudative age-related macular degeneration, bilateral, advanced atrophic with subfoveal involvement: Secondary | ICD-10-CM | POA: Diagnosis not present

## 2017-04-14 DIAGNOSIS — E1165 Type 2 diabetes mellitus with hyperglycemia: Secondary | ICD-10-CM | POA: Diagnosis not present

## 2017-04-14 DIAGNOSIS — Z6839 Body mass index (BMI) 39.0-39.9, adult: Secondary | ICD-10-CM | POA: Diagnosis not present

## 2017-04-14 DIAGNOSIS — J3489 Other specified disorders of nose and nasal sinuses: Secondary | ICD-10-CM | POA: Diagnosis not present

## 2017-04-14 DIAGNOSIS — E7800 Pure hypercholesterolemia, unspecified: Secondary | ICD-10-CM | POA: Diagnosis not present

## 2017-04-14 DIAGNOSIS — I129 Hypertensive chronic kidney disease with stage 1 through stage 4 chronic kidney disease, or unspecified chronic kidney disease: Secondary | ICD-10-CM | POA: Diagnosis not present

## 2017-04-14 DIAGNOSIS — E1142 Type 2 diabetes mellitus with diabetic polyneuropathy: Secondary | ICD-10-CM | POA: Diagnosis not present

## 2017-04-14 DIAGNOSIS — E1122 Type 2 diabetes mellitus with diabetic chronic kidney disease: Secondary | ICD-10-CM | POA: Diagnosis not present

## 2017-04-14 DIAGNOSIS — Z1389 Encounter for screening for other disorder: Secondary | ICD-10-CM | POA: Diagnosis not present

## 2017-04-14 DIAGNOSIS — Z0000 Encounter for general adult medical examination without abnormal findings: Secondary | ICD-10-CM | POA: Diagnosis not present

## 2017-04-14 DIAGNOSIS — Z7984 Long term (current) use of oral hypoglycemic drugs: Secondary | ICD-10-CM | POA: Diagnosis not present

## 2017-04-14 DIAGNOSIS — N183 Chronic kidney disease, stage 3 (moderate): Secondary | ICD-10-CM | POA: Diagnosis not present

## 2017-04-26 DIAGNOSIS — H3562 Retinal hemorrhage, left eye: Secondary | ICD-10-CM | POA: Diagnosis not present

## 2017-04-26 DIAGNOSIS — H353213 Exudative age-related macular degeneration, right eye, with inactive scar: Secondary | ICD-10-CM | POA: Diagnosis not present

## 2017-04-26 DIAGNOSIS — G4733 Obstructive sleep apnea (adult) (pediatric): Secondary | ICD-10-CM | POA: Diagnosis not present

## 2017-04-26 DIAGNOSIS — H353222 Exudative age-related macular degeneration, left eye, with inactive choroidal neovascularization: Secondary | ICD-10-CM | POA: Diagnosis not present

## 2017-04-26 DIAGNOSIS — H353134 Nonexudative age-related macular degeneration, bilateral, advanced atrophic with subfoveal involvement: Secondary | ICD-10-CM | POA: Diagnosis not present

## 2017-05-26 DIAGNOSIS — G4733 Obstructive sleep apnea (adult) (pediatric): Secondary | ICD-10-CM | POA: Diagnosis not present

## 2017-06-22 DIAGNOSIS — H25013 Cortical age-related cataract, bilateral: Secondary | ICD-10-CM | POA: Diagnosis not present

## 2017-06-22 DIAGNOSIS — H353231 Exudative age-related macular degeneration, bilateral, with active choroidal neovascularization: Secondary | ICD-10-CM | POA: Diagnosis not present

## 2017-06-22 DIAGNOSIS — H524 Presbyopia: Secondary | ICD-10-CM | POA: Diagnosis not present

## 2017-06-22 DIAGNOSIS — H2513 Age-related nuclear cataract, bilateral: Secondary | ICD-10-CM | POA: Diagnosis not present

## 2017-06-22 DIAGNOSIS — H5203 Hypermetropia, bilateral: Secondary | ICD-10-CM | POA: Diagnosis not present

## 2017-07-05 DIAGNOSIS — H2512 Age-related nuclear cataract, left eye: Secondary | ICD-10-CM | POA: Diagnosis not present

## 2017-07-05 DIAGNOSIS — H25011 Cortical age-related cataract, right eye: Secondary | ICD-10-CM | POA: Diagnosis not present

## 2017-07-05 DIAGNOSIS — H25012 Cortical age-related cataract, left eye: Secondary | ICD-10-CM | POA: Diagnosis not present

## 2017-07-05 DIAGNOSIS — H2511 Age-related nuclear cataract, right eye: Secondary | ICD-10-CM | POA: Diagnosis not present

## 2017-07-05 DIAGNOSIS — G4733 Obstructive sleep apnea (adult) (pediatric): Secondary | ICD-10-CM | POA: Diagnosis not present

## 2017-07-12 DIAGNOSIS — H25012 Cortical age-related cataract, left eye: Secondary | ICD-10-CM | POA: Diagnosis not present

## 2017-07-12 DIAGNOSIS — H2512 Age-related nuclear cataract, left eye: Secondary | ICD-10-CM | POA: Diagnosis not present

## 2017-08-01 DIAGNOSIS — H3562 Retinal hemorrhage, left eye: Secondary | ICD-10-CM | POA: Diagnosis not present

## 2017-08-01 DIAGNOSIS — H353213 Exudative age-related macular degeneration, right eye, with inactive scar: Secondary | ICD-10-CM | POA: Diagnosis not present

## 2017-08-01 DIAGNOSIS — H353221 Exudative age-related macular degeneration, left eye, with active choroidal neovascularization: Secondary | ICD-10-CM | POA: Diagnosis not present

## 2017-08-01 DIAGNOSIS — H353134 Nonexudative age-related macular degeneration, bilateral, advanced atrophic with subfoveal involvement: Secondary | ICD-10-CM | POA: Diagnosis not present

## 2017-08-15 DIAGNOSIS — N183 Chronic kidney disease, stage 3 (moderate): Secondary | ICD-10-CM | POA: Diagnosis not present

## 2017-08-15 DIAGNOSIS — G4733 Obstructive sleep apnea (adult) (pediatric): Secondary | ICD-10-CM | POA: Diagnosis not present

## 2017-08-15 DIAGNOSIS — E1122 Type 2 diabetes mellitus with diabetic chronic kidney disease: Secondary | ICD-10-CM | POA: Diagnosis not present

## 2017-08-15 DIAGNOSIS — I129 Hypertensive chronic kidney disease with stage 1 through stage 4 chronic kidney disease, or unspecified chronic kidney disease: Secondary | ICD-10-CM | POA: Diagnosis not present

## 2017-08-15 DIAGNOSIS — E1142 Type 2 diabetes mellitus with diabetic polyneuropathy: Secondary | ICD-10-CM | POA: Diagnosis not present

## 2017-08-30 DIAGNOSIS — G4733 Obstructive sleep apnea (adult) (pediatric): Secondary | ICD-10-CM | POA: Diagnosis not present

## 2017-10-16 DIAGNOSIS — H353221 Exudative age-related macular degeneration, left eye, with active choroidal neovascularization: Secondary | ICD-10-CM | POA: Diagnosis not present

## 2017-10-16 DIAGNOSIS — H353213 Exudative age-related macular degeneration, right eye, with inactive scar: Secondary | ICD-10-CM | POA: Diagnosis not present

## 2017-10-16 DIAGNOSIS — H353134 Nonexudative age-related macular degeneration, bilateral, advanced atrophic with subfoveal involvement: Secondary | ICD-10-CM | POA: Diagnosis not present

## 2017-10-16 DIAGNOSIS — H3562 Retinal hemorrhage, left eye: Secondary | ICD-10-CM | POA: Diagnosis not present

## 2017-11-13 DIAGNOSIS — G4733 Obstructive sleep apnea (adult) (pediatric): Secondary | ICD-10-CM | POA: Diagnosis not present

## 2017-12-13 DIAGNOSIS — G4733 Obstructive sleep apnea (adult) (pediatric): Secondary | ICD-10-CM | POA: Diagnosis not present

## 2017-12-14 DIAGNOSIS — H353134 Nonexudative age-related macular degeneration, bilateral, advanced atrophic with subfoveal involvement: Secondary | ICD-10-CM | POA: Diagnosis not present

## 2017-12-14 DIAGNOSIS — H353211 Exudative age-related macular degeneration, right eye, with active choroidal neovascularization: Secondary | ICD-10-CM | POA: Diagnosis not present

## 2017-12-14 DIAGNOSIS — H3562 Retinal hemorrhage, left eye: Secondary | ICD-10-CM | POA: Diagnosis not present

## 2017-12-14 DIAGNOSIS — H353213 Exudative age-related macular degeneration, right eye, with inactive scar: Secondary | ICD-10-CM | POA: Diagnosis not present

## 2017-12-14 DIAGNOSIS — H353221 Exudative age-related macular degeneration, left eye, with active choroidal neovascularization: Secondary | ICD-10-CM | POA: Diagnosis not present

## 2018-01-12 DIAGNOSIS — G4733 Obstructive sleep apnea (adult) (pediatric): Secondary | ICD-10-CM | POA: Diagnosis not present

## 2018-01-15 DIAGNOSIS — E1122 Type 2 diabetes mellitus with diabetic chronic kidney disease: Secondary | ICD-10-CM | POA: Diagnosis not present

## 2018-01-15 DIAGNOSIS — I129 Hypertensive chronic kidney disease with stage 1 through stage 4 chronic kidney disease, or unspecified chronic kidney disease: Secondary | ICD-10-CM | POA: Diagnosis not present

## 2018-01-15 DIAGNOSIS — N183 Chronic kidney disease, stage 3 (moderate): Secondary | ICD-10-CM | POA: Diagnosis not present

## 2018-01-15 DIAGNOSIS — G8929 Other chronic pain: Secondary | ICD-10-CM | POA: Diagnosis not present

## 2018-01-15 DIAGNOSIS — M25562 Pain in left knee: Secondary | ICD-10-CM | POA: Diagnosis not present

## 2018-01-15 DIAGNOSIS — E1142 Type 2 diabetes mellitus with diabetic polyneuropathy: Secondary | ICD-10-CM | POA: Diagnosis not present

## 2018-01-25 DIAGNOSIS — H3562 Retinal hemorrhage, left eye: Secondary | ICD-10-CM | POA: Diagnosis not present

## 2018-01-25 DIAGNOSIS — E119 Type 2 diabetes mellitus without complications: Secondary | ICD-10-CM | POA: Diagnosis not present

## 2018-01-25 DIAGNOSIS — H353221 Exudative age-related macular degeneration, left eye, with active choroidal neovascularization: Secondary | ICD-10-CM | POA: Diagnosis not present

## 2018-02-23 DIAGNOSIS — G4733 Obstructive sleep apnea (adult) (pediatric): Secondary | ICD-10-CM | POA: Diagnosis not present

## 2018-03-08 DIAGNOSIS — H43812 Vitreous degeneration, left eye: Secondary | ICD-10-CM | POA: Diagnosis not present

## 2018-03-08 DIAGNOSIS — E119 Type 2 diabetes mellitus without complications: Secondary | ICD-10-CM | POA: Diagnosis not present

## 2018-03-08 DIAGNOSIS — H3562 Retinal hemorrhage, left eye: Secondary | ICD-10-CM | POA: Diagnosis not present

## 2018-03-08 DIAGNOSIS — H353221 Exudative age-related macular degeneration, left eye, with active choroidal neovascularization: Secondary | ICD-10-CM | POA: Diagnosis not present

## 2018-03-28 DIAGNOSIS — G4733 Obstructive sleep apnea (adult) (pediatric): Secondary | ICD-10-CM | POA: Diagnosis not present

## 2018-04-18 DIAGNOSIS — N183 Chronic kidney disease, stage 3 (moderate): Secondary | ICD-10-CM | POA: Diagnosis not present

## 2018-04-18 DIAGNOSIS — E1122 Type 2 diabetes mellitus with diabetic chronic kidney disease: Secondary | ICD-10-CM | POA: Diagnosis not present

## 2018-04-18 DIAGNOSIS — I2510 Atherosclerotic heart disease of native coronary artery without angina pectoris: Secondary | ICD-10-CM | POA: Diagnosis not present

## 2018-04-18 DIAGNOSIS — Z7984 Long term (current) use of oral hypoglycemic drugs: Secondary | ICD-10-CM | POA: Diagnosis not present

## 2018-04-19 DIAGNOSIS — Z6838 Body mass index (BMI) 38.0-38.9, adult: Secondary | ICD-10-CM | POA: Diagnosis not present

## 2018-04-19 DIAGNOSIS — E7800 Pure hypercholesterolemia, unspecified: Secondary | ICD-10-CM | POA: Diagnosis not present

## 2018-04-19 DIAGNOSIS — E1142 Type 2 diabetes mellitus with diabetic polyneuropathy: Secondary | ICD-10-CM | POA: Diagnosis not present

## 2018-04-19 DIAGNOSIS — N183 Chronic kidney disease, stage 3 (moderate): Secondary | ICD-10-CM | POA: Diagnosis not present

## 2018-04-19 DIAGNOSIS — E1122 Type 2 diabetes mellitus with diabetic chronic kidney disease: Secondary | ICD-10-CM | POA: Diagnosis not present

## 2018-04-19 DIAGNOSIS — Z0000 Encounter for general adult medical examination without abnormal findings: Secondary | ICD-10-CM | POA: Diagnosis not present

## 2018-04-19 DIAGNOSIS — I129 Hypertensive chronic kidney disease with stage 1 through stage 4 chronic kidney disease, or unspecified chronic kidney disease: Secondary | ICD-10-CM | POA: Diagnosis not present

## 2018-04-19 DIAGNOSIS — G4733 Obstructive sleep apnea (adult) (pediatric): Secondary | ICD-10-CM | POA: Diagnosis not present

## 2018-04-19 DIAGNOSIS — H6123 Impacted cerumen, bilateral: Secondary | ICD-10-CM | POA: Diagnosis not present

## 2018-04-19 DIAGNOSIS — Z1389 Encounter for screening for other disorder: Secondary | ICD-10-CM | POA: Diagnosis not present

## 2018-04-19 DIAGNOSIS — R1032 Left lower quadrant pain: Secondary | ICD-10-CM | POA: Diagnosis not present

## 2018-04-19 DIAGNOSIS — Z7984 Long term (current) use of oral hypoglycemic drugs: Secondary | ICD-10-CM | POA: Diagnosis not present

## 2018-04-20 DIAGNOSIS — H43812 Vitreous degeneration, left eye: Secondary | ICD-10-CM | POA: Diagnosis not present

## 2018-04-20 DIAGNOSIS — H353221 Exudative age-related macular degeneration, left eye, with active choroidal neovascularization: Secondary | ICD-10-CM | POA: Diagnosis not present

## 2018-04-20 DIAGNOSIS — H3562 Retinal hemorrhage, left eye: Secondary | ICD-10-CM | POA: Diagnosis not present

## 2018-04-30 DIAGNOSIS — G4733 Obstructive sleep apnea (adult) (pediatric): Secondary | ICD-10-CM | POA: Diagnosis not present

## 2018-05-01 DIAGNOSIS — E1142 Type 2 diabetes mellitus with diabetic polyneuropathy: Secondary | ICD-10-CM | POA: Diagnosis not present

## 2018-05-01 DIAGNOSIS — Z7984 Long term (current) use of oral hypoglycemic drugs: Secondary | ICD-10-CM | POA: Diagnosis not present

## 2018-05-01 DIAGNOSIS — N183 Chronic kidney disease, stage 3 (moderate): Secondary | ICD-10-CM | POA: Diagnosis not present

## 2018-05-01 DIAGNOSIS — I129 Hypertensive chronic kidney disease with stage 1 through stage 4 chronic kidney disease, or unspecified chronic kidney disease: Secondary | ICD-10-CM | POA: Diagnosis not present

## 2018-05-01 DIAGNOSIS — E1122 Type 2 diabetes mellitus with diabetic chronic kidney disease: Secondary | ICD-10-CM | POA: Diagnosis not present

## 2018-05-01 DIAGNOSIS — I2510 Atherosclerotic heart disease of native coronary artery without angina pectoris: Secondary | ICD-10-CM | POA: Diagnosis not present

## 2018-05-30 DIAGNOSIS — G4733 Obstructive sleep apnea (adult) (pediatric): Secondary | ICD-10-CM | POA: Diagnosis not present

## 2018-06-13 DIAGNOSIS — H43811 Vitreous degeneration, right eye: Secondary | ICD-10-CM | POA: Diagnosis not present

## 2018-06-13 DIAGNOSIS — H3562 Retinal hemorrhage, left eye: Secondary | ICD-10-CM | POA: Diagnosis not present

## 2018-06-13 DIAGNOSIS — H43812 Vitreous degeneration, left eye: Secondary | ICD-10-CM | POA: Diagnosis not present

## 2018-06-13 DIAGNOSIS — H353221 Exudative age-related macular degeneration, left eye, with active choroidal neovascularization: Secondary | ICD-10-CM | POA: Diagnosis not present

## 2018-06-29 DIAGNOSIS — G4733 Obstructive sleep apnea (adult) (pediatric): Secondary | ICD-10-CM | POA: Diagnosis not present

## 2018-07-27 DIAGNOSIS — Z7984 Long term (current) use of oral hypoglycemic drugs: Secondary | ICD-10-CM | POA: Diagnosis not present

## 2018-07-27 DIAGNOSIS — E1122 Type 2 diabetes mellitus with diabetic chronic kidney disease: Secondary | ICD-10-CM | POA: Diagnosis not present

## 2018-07-27 DIAGNOSIS — I129 Hypertensive chronic kidney disease with stage 1 through stage 4 chronic kidney disease, or unspecified chronic kidney disease: Secondary | ICD-10-CM | POA: Diagnosis not present

## 2018-07-27 DIAGNOSIS — I2510 Atherosclerotic heart disease of native coronary artery without angina pectoris: Secondary | ICD-10-CM | POA: Diagnosis not present

## 2018-07-27 DIAGNOSIS — E1142 Type 2 diabetes mellitus with diabetic polyneuropathy: Secondary | ICD-10-CM | POA: Diagnosis not present

## 2018-07-27 DIAGNOSIS — N183 Chronic kidney disease, stage 3 (moderate): Secondary | ICD-10-CM | POA: Diagnosis not present

## 2018-07-30 DIAGNOSIS — G4733 Obstructive sleep apnea (adult) (pediatric): Secondary | ICD-10-CM | POA: Diagnosis not present

## 2018-09-05 DIAGNOSIS — G4733 Obstructive sleep apnea (adult) (pediatric): Secondary | ICD-10-CM | POA: Diagnosis not present

## 2018-09-21 DIAGNOSIS — I2510 Atherosclerotic heart disease of native coronary artery without angina pectoris: Secondary | ICD-10-CM | POA: Diagnosis not present

## 2018-09-21 DIAGNOSIS — F4321 Adjustment disorder with depressed mood: Secondary | ICD-10-CM | POA: Diagnosis not present

## 2018-09-21 DIAGNOSIS — Z7984 Long term (current) use of oral hypoglycemic drugs: Secondary | ICD-10-CM | POA: Diagnosis not present

## 2018-09-21 DIAGNOSIS — E1122 Type 2 diabetes mellitus with diabetic chronic kidney disease: Secondary | ICD-10-CM | POA: Diagnosis not present

## 2018-09-21 DIAGNOSIS — E1142 Type 2 diabetes mellitus with diabetic polyneuropathy: Secondary | ICD-10-CM | POA: Diagnosis not present

## 2018-09-21 DIAGNOSIS — I129 Hypertensive chronic kidney disease with stage 1 through stage 4 chronic kidney disease, or unspecified chronic kidney disease: Secondary | ICD-10-CM | POA: Diagnosis not present

## 2018-09-21 DIAGNOSIS — N183 Chronic kidney disease, stage 3 (moderate): Secondary | ICD-10-CM | POA: Diagnosis not present

## 2018-10-16 DIAGNOSIS — Z23 Encounter for immunization: Secondary | ICD-10-CM | POA: Diagnosis not present

## 2018-10-16 DIAGNOSIS — I10 Essential (primary) hypertension: Secondary | ICD-10-CM | POA: Diagnosis not present

## 2018-10-16 DIAGNOSIS — G4733 Obstructive sleep apnea (adult) (pediatric): Secondary | ICD-10-CM | POA: Diagnosis not present

## 2018-10-16 DIAGNOSIS — F4321 Adjustment disorder with depressed mood: Secondary | ICD-10-CM | POA: Diagnosis not present

## 2018-10-16 DIAGNOSIS — E1122 Type 2 diabetes mellitus with diabetic chronic kidney disease: Secondary | ICD-10-CM | POA: Diagnosis not present

## 2018-11-29 DIAGNOSIS — G4733 Obstructive sleep apnea (adult) (pediatric): Secondary | ICD-10-CM | POA: Diagnosis not present

## 2018-12-03 DIAGNOSIS — F4321 Adjustment disorder with depressed mood: Secondary | ICD-10-CM | POA: Diagnosis not present

## 2018-12-03 DIAGNOSIS — E1142 Type 2 diabetes mellitus with diabetic polyneuropathy: Secondary | ICD-10-CM | POA: Diagnosis not present

## 2018-12-03 DIAGNOSIS — N183 Chronic kidney disease, stage 3 (moderate): Secondary | ICD-10-CM | POA: Diagnosis not present

## 2018-12-03 DIAGNOSIS — E1122 Type 2 diabetes mellitus with diabetic chronic kidney disease: Secondary | ICD-10-CM | POA: Diagnosis not present

## 2018-12-03 DIAGNOSIS — I129 Hypertensive chronic kidney disease with stage 1 through stage 4 chronic kidney disease, or unspecified chronic kidney disease: Secondary | ICD-10-CM | POA: Diagnosis not present

## 2018-12-03 DIAGNOSIS — I2510 Atherosclerotic heart disease of native coronary artery without angina pectoris: Secondary | ICD-10-CM | POA: Diagnosis not present

## 2019-01-28 DIAGNOSIS — G4733 Obstructive sleep apnea (adult) (pediatric): Secondary | ICD-10-CM | POA: Diagnosis not present

## 2019-02-05 DIAGNOSIS — H353134 Nonexudative age-related macular degeneration, bilateral, advanced atrophic with subfoveal involvement: Secondary | ICD-10-CM | POA: Diagnosis not present

## 2019-02-05 DIAGNOSIS — H353213 Exudative age-related macular degeneration, right eye, with inactive scar: Secondary | ICD-10-CM | POA: Diagnosis not present

## 2019-02-05 DIAGNOSIS — H353222 Exudative age-related macular degeneration, left eye, with inactive choroidal neovascularization: Secondary | ICD-10-CM | POA: Diagnosis not present

## 2019-02-05 DIAGNOSIS — H3562 Retinal hemorrhage, left eye: Secondary | ICD-10-CM | POA: Diagnosis not present

## 2019-02-14 DIAGNOSIS — E1122 Type 2 diabetes mellitus with diabetic chronic kidney disease: Secondary | ICD-10-CM | POA: Diagnosis not present

## 2019-02-14 DIAGNOSIS — N183 Chronic kidney disease, stage 3 (moderate): Secondary | ICD-10-CM | POA: Diagnosis not present

## 2019-02-14 DIAGNOSIS — F4321 Adjustment disorder with depressed mood: Secondary | ICD-10-CM | POA: Diagnosis not present

## 2019-02-14 DIAGNOSIS — I129 Hypertensive chronic kidney disease with stage 1 through stage 4 chronic kidney disease, or unspecified chronic kidney disease: Secondary | ICD-10-CM | POA: Diagnosis not present

## 2019-02-14 DIAGNOSIS — E1142 Type 2 diabetes mellitus with diabetic polyneuropathy: Secondary | ICD-10-CM | POA: Diagnosis not present

## 2019-02-14 DIAGNOSIS — I2510 Atherosclerotic heart disease of native coronary artery without angina pectoris: Secondary | ICD-10-CM | POA: Diagnosis not present

## 2019-02-14 DIAGNOSIS — K5900 Constipation, unspecified: Secondary | ICD-10-CM | POA: Diagnosis not present

## 2019-02-14 DIAGNOSIS — Z7984 Long term (current) use of oral hypoglycemic drugs: Secondary | ICD-10-CM | POA: Diagnosis not present

## 2019-02-27 DIAGNOSIS — G4733 Obstructive sleep apnea (adult) (pediatric): Secondary | ICD-10-CM | POA: Diagnosis not present

## 2019-05-01 DIAGNOSIS — Z1389 Encounter for screening for other disorder: Secondary | ICD-10-CM | POA: Diagnosis not present

## 2019-05-01 DIAGNOSIS — Z0000 Encounter for general adult medical examination without abnormal findings: Secondary | ICD-10-CM | POA: Diagnosis not present

## 2019-06-04 DIAGNOSIS — E7800 Pure hypercholesterolemia, unspecified: Secondary | ICD-10-CM | POA: Diagnosis not present

## 2019-06-04 DIAGNOSIS — I129 Hypertensive chronic kidney disease with stage 1 through stage 4 chronic kidney disease, or unspecified chronic kidney disease: Secondary | ICD-10-CM | POA: Diagnosis not present

## 2019-06-04 DIAGNOSIS — Z7984 Long term (current) use of oral hypoglycemic drugs: Secondary | ICD-10-CM | POA: Diagnosis not present

## 2019-06-04 DIAGNOSIS — N183 Chronic kidney disease, stage 3 (moderate): Secondary | ICD-10-CM | POA: Diagnosis not present

## 2019-06-04 DIAGNOSIS — E1122 Type 2 diabetes mellitus with diabetic chronic kidney disease: Secondary | ICD-10-CM | POA: Diagnosis not present

## 2019-06-04 DIAGNOSIS — E1142 Type 2 diabetes mellitus with diabetic polyneuropathy: Secondary | ICD-10-CM | POA: Diagnosis not present

## 2019-06-04 DIAGNOSIS — Z23 Encounter for immunization: Secondary | ICD-10-CM | POA: Diagnosis not present

## 2019-08-23 DIAGNOSIS — G4733 Obstructive sleep apnea (adult) (pediatric): Secondary | ICD-10-CM | POA: Diagnosis not present

## 2019-09-03 DIAGNOSIS — H3562 Retinal hemorrhage, left eye: Secondary | ICD-10-CM | POA: Diagnosis not present

## 2019-09-03 DIAGNOSIS — H353221 Exudative age-related macular degeneration, left eye, with active choroidal neovascularization: Secondary | ICD-10-CM | POA: Diagnosis not present

## 2019-09-03 DIAGNOSIS — H353134 Nonexudative age-related macular degeneration, bilateral, advanced atrophic with subfoveal involvement: Secondary | ICD-10-CM | POA: Diagnosis not present

## 2019-09-03 DIAGNOSIS — H353213 Exudative age-related macular degeneration, right eye, with inactive scar: Secondary | ICD-10-CM | POA: Diagnosis not present

## 2019-09-24 DIAGNOSIS — G4733 Obstructive sleep apnea (adult) (pediatric): Secondary | ICD-10-CM | POA: Diagnosis not present

## 2019-10-18 DIAGNOSIS — N1832 Chronic kidney disease, stage 3b: Secondary | ICD-10-CM | POA: Diagnosis not present

## 2019-10-18 DIAGNOSIS — G4733 Obstructive sleep apnea (adult) (pediatric): Secondary | ICD-10-CM | POA: Diagnosis not present

## 2019-10-18 DIAGNOSIS — Z7984 Long term (current) use of oral hypoglycemic drugs: Secondary | ICD-10-CM | POA: Diagnosis not present

## 2019-10-18 DIAGNOSIS — E1122 Type 2 diabetes mellitus with diabetic chronic kidney disease: Secondary | ICD-10-CM | POA: Diagnosis not present

## 2019-10-18 DIAGNOSIS — I129 Hypertensive chronic kidney disease with stage 1 through stage 4 chronic kidney disease, or unspecified chronic kidney disease: Secondary | ICD-10-CM | POA: Diagnosis not present

## 2019-10-18 DIAGNOSIS — L989 Disorder of the skin and subcutaneous tissue, unspecified: Secondary | ICD-10-CM | POA: Diagnosis not present

## 2019-10-24 DIAGNOSIS — L570 Actinic keratosis: Secondary | ICD-10-CM | POA: Diagnosis not present

## 2019-10-24 DIAGNOSIS — C44319 Basal cell carcinoma of skin of other parts of face: Secondary | ICD-10-CM | POA: Diagnosis not present

## 2019-10-24 DIAGNOSIS — Z85828 Personal history of other malignant neoplasm of skin: Secondary | ICD-10-CM | POA: Diagnosis not present

## 2019-10-24 DIAGNOSIS — G4733 Obstructive sleep apnea (adult) (pediatric): Secondary | ICD-10-CM | POA: Diagnosis not present

## 2019-11-04 DIAGNOSIS — H353221 Exudative age-related macular degeneration, left eye, with active choroidal neovascularization: Secondary | ICD-10-CM | POA: Diagnosis not present

## 2019-11-04 DIAGNOSIS — H353213 Exudative age-related macular degeneration, right eye, with inactive scar: Secondary | ICD-10-CM | POA: Diagnosis not present

## 2019-11-04 DIAGNOSIS — E119 Type 2 diabetes mellitus without complications: Secondary | ICD-10-CM | POA: Diagnosis not present

## 2019-11-04 DIAGNOSIS — H353134 Nonexudative age-related macular degeneration, bilateral, advanced atrophic with subfoveal involvement: Secondary | ICD-10-CM | POA: Diagnosis not present

## 2019-11-12 DIAGNOSIS — C44319 Basal cell carcinoma of skin of other parts of face: Secondary | ICD-10-CM | POA: Diagnosis not present

## 2019-11-12 DIAGNOSIS — Z85828 Personal history of other malignant neoplasm of skin: Secondary | ICD-10-CM | POA: Diagnosis not present

## 2019-11-14 DIAGNOSIS — G4733 Obstructive sleep apnea (adult) (pediatric): Secondary | ICD-10-CM | POA: Diagnosis not present

## 2019-11-25 DIAGNOSIS — I129 Hypertensive chronic kidney disease with stage 1 through stage 4 chronic kidney disease, or unspecified chronic kidney disease: Secondary | ICD-10-CM | POA: Diagnosis not present

## 2019-11-25 DIAGNOSIS — I2510 Atherosclerotic heart disease of native coronary artery without angina pectoris: Secondary | ICD-10-CM | POA: Diagnosis not present

## 2019-11-25 DIAGNOSIS — N1832 Chronic kidney disease, stage 3b: Secondary | ICD-10-CM | POA: Diagnosis not present

## 2019-11-25 DIAGNOSIS — Z7984 Long term (current) use of oral hypoglycemic drugs: Secondary | ICD-10-CM | POA: Diagnosis not present

## 2019-11-25 DIAGNOSIS — E7800 Pure hypercholesterolemia, unspecified: Secondary | ICD-10-CM | POA: Diagnosis not present

## 2019-11-25 DIAGNOSIS — F4321 Adjustment disorder with depressed mood: Secondary | ICD-10-CM | POA: Diagnosis not present

## 2019-11-25 DIAGNOSIS — E1122 Type 2 diabetes mellitus with diabetic chronic kidney disease: Secondary | ICD-10-CM | POA: Diagnosis not present

## 2019-11-25 DIAGNOSIS — E1142 Type 2 diabetes mellitus with diabetic polyneuropathy: Secondary | ICD-10-CM | POA: Diagnosis not present

## 2019-11-26 DIAGNOSIS — I129 Hypertensive chronic kidney disease with stage 1 through stage 4 chronic kidney disease, or unspecified chronic kidney disease: Secondary | ICD-10-CM | POA: Diagnosis not present

## 2019-11-26 DIAGNOSIS — Z4802 Encounter for removal of sutures: Secondary | ICD-10-CM | POA: Diagnosis not present

## 2019-12-06 DIAGNOSIS — G4733 Obstructive sleep apnea (adult) (pediatric): Secondary | ICD-10-CM | POA: Diagnosis not present

## 2019-12-16 DIAGNOSIS — I129 Hypertensive chronic kidney disease with stage 1 through stage 4 chronic kidney disease, or unspecified chronic kidney disease: Secondary | ICD-10-CM | POA: Diagnosis not present

## 2020-01-01 ENCOUNTER — Encounter (INDEPENDENT_AMBULATORY_CARE_PROVIDER_SITE_OTHER): Payer: Self-pay | Admitting: Ophthalmology

## 2020-01-01 ENCOUNTER — Ambulatory Visit (INDEPENDENT_AMBULATORY_CARE_PROVIDER_SITE_OTHER): Payer: PPO | Admitting: Ophthalmology

## 2020-01-01 ENCOUNTER — Other Ambulatory Visit: Payer: Self-pay

## 2020-01-01 DIAGNOSIS — H353134 Nonexudative age-related macular degeneration, bilateral, advanced atrophic with subfoveal involvement: Secondary | ICD-10-CM

## 2020-01-01 DIAGNOSIS — H353213 Exudative age-related macular degeneration, right eye, with inactive scar: Secondary | ICD-10-CM | POA: Diagnosis not present

## 2020-01-01 DIAGNOSIS — H353221 Exudative age-related macular degeneration, left eye, with active choroidal neovascularization: Secondary | ICD-10-CM | POA: Diagnosis not present

## 2020-01-01 DIAGNOSIS — H353222 Exudative age-related macular degeneration, left eye, with inactive choroidal neovascularization: Secondary | ICD-10-CM | POA: Insufficient documentation

## 2020-01-01 MED ORDER — BEVACIZUMAB CHEMO INJECTION 1.25MG/0.05ML SYRINGE FOR KALEIDOSCOPE
1.2500 mg | INTRAVITREAL | Status: AC | PRN
  Administered 2020-01-01: 1.25 mg via INTRAVITREAL

## 2020-01-01 NOTE — Patient Instructions (Signed)

## 2020-01-01 NOTE — Progress Notes (Signed)
01/01/2020     CHIEF COMPLAINT Patient presents for Retina Follow Up   HISTORY OF PRESENT ILLNESS: Jimmy Butler is a 86 y.o. male who presents to the clinic today for:   HPI    Retina Follow Up    Patient presents with  Wet AMD.  In left eye.  Duration of 2 months.  Since onset it is gradually worsening.          Comments    2 month follow up - OCT OU, Possible Avastin OS Patient states that his vision is gradually worsening. No other complaints. LBS unknown A1C unknown       Last edited by Gerda Diss, [place] on 01/01/2020  8:15 AM. (History)      Referring physician: Josetta Huddle, MD [address],  [place] [address]  HISTORICAL INFORMATION:   Selected notes from the [facility]: No current outpatient medications on file. (Ophthalmic Drugs)   No current facility-administered medications for this visit. (Ophthalmic Drugs)   Current Outpatient Medications (Other)  Medication Sig  . aspirin 325 MG tablet Take 325 mg by mouth daily.    . clopidogrel (PLAVIX) 75 MG tablet Take 75 mg by mouth daily.    Marland Kitchen lisinopril-hydrochlorothiazide (PRINZIDE,ZESTORETIC) 20-12.5 MG per tablet Take 1 tablet by mouth daily.    . metoprolol tartrate (LOPRESSOR) 25 MG tablet Take 25 mg by mouth 2 (two) times daily.  . Misc Natural Products (GLUCOSAMINE CHONDROITIN ADV PO) Take 1 tablet by mouth 2 (two) times daily.   . Multiple Vitamins-Minerals (EYE VITAMINS PO) Take 1 tablet by mouth daily.   . rosuvastatin (CRESTOR) 10 MG tablet Take 5 mg by mouth daily.  . vitamin B-12 (CYANOCOBALAMIN) 1000 MCG tablet Take 1,000 mcg by mouth daily.     No current facility-administered medications for this visit. (Other)      REVIEW OF SYSTEMS:    ALLERGIES No Known Allergies  PAST MEDICAL HISTORY Past Medical History:  Diagnosis Date  . Diabetes mellitus without complication ([place])    History reviewed. No pertinent surgical  history.  FAMILY HISTORY Family History  Problem Relation Age of Onset  . Heart disease Father     SOCIAL HISTORY Social History   Tobacco Use  . Smoking status: Former Smoker    Packs/day: 1.50    Years: 40.00    Pack years: 60.00    Quit date: 09/19/1968    Years since quitting: 51.3  . Smokeless tobacco: Never Used  Substance Use Topics  . Alcohol use: No  . Drug use: No         OPHTHALMIC EXAM:  Base Eye Exam    Visual Acuity (Snellen - Linear)      Right Left   Dist [place] CF @ 5' 20/200   Dist ph [place] NI NI       Tonometry (Tonopen, 8:20 AM)      Right Left   Pressure 12 12       Pupils      Pupils   Right PERRL   Left PERRL       Visual Fields (Counting fingers)      Left Right    Full Full       Extraocular Movement      Right Left    Full Full       Neuro/Psych    Oriented x3: Yes   Mood/Affect: Normal  Dilation    Left eye: 1.0% Mydriacyl, 2.5% Phenylephrine @ 8:20 AM        Slit Lamp and Fundus Exam    External Exam      Right Left   External Normal Normal       Slit Lamp Exam      Right Left   Lids/Lashes Normal Normal   Conjunctiva/Sclera White and quiet White and quiet   Cornea Clear Clear   Anterior Chamber Deep and quiet Deep and quiet   Iris Round and reactive Round and reactive   Lens Posterior chamber intraocular lens Posterior chamber intraocular lens   Anterior Vitreous Normal Normal       Fundus Exam      Right Left   Posterior Vitreous  Posterior vitreous detachment   Disc  Normal   C/D Ratio  0.2   Macula  Drusen, Disciform scar, large with pigmentary ring.  No active subretinal hemorrhage or fluid peripherally.   Vessels  Normal   Periphery  Normal          IMAGING AND PROCEDURES  Imaging and Procedures for @TODAY @  OCT, Retina - OU - Both Eyes       Right Eye Quality was good. Findings include choroidal neovascular membrane, cystoid macular edema.   Left Eye Quality was good. Findings  include no SRF, no IRF, retinal drusen , central retinal atrophy, subretinal scarring, outer retinal atrophy, choroidal neovascular membrane.   Notes OD, old disc a form scar, inactive with inactive edges no CME  OS, less intraretinal and subretinal fluid over a disc for macular scar.  At 97-month interval of exam and Avastin.  This confirms active lesion, will control with repeat Avastin OS today, and exam and exam in 2 months       Intravitreal Injection, Pharmacologic Agent - OS - Left Eye       Time Out 01/01/2020. 9:16 AM. Confirmed correct patient, procedure, site, and patient consented.   Anesthesia Topical anesthesia was used. Anesthetic medications included Akten 3.5%.   Procedure Preparation included 5% betadine to ocular surface, 10% betadine to eyelids, Ofloxacin . A 30 gauge needle was used.   Injection:  1.25 mg Bevacizumab (AVASTIN) SOLN   NDC: [id number], Lot: [id number]   Route: Intravitreal, Site: Left Eye, Waste: 0 mg  Post-op Post injection exam found visual acuity of at least counting fingers. The patient tolerated the procedure well. There were no complications. The patient received written and verbal post procedure care education. Post injection medications were not given.                 ASSESSMENT/PLAN:  No problem-specific Assessment & Plan notes found for this encounter.      ICD-10-CM   1. Exudative age-related macular degeneration of left eye with active choroidal neovascularization (HCC)  H35.3221 OCT, Retina - OU - Both Eyes    Intravitreal Injection, Pharmacologic Agent - OS - Left Eye    Bevacizumab (AVASTIN) SOLN 1.25 mg  2. 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  Goal OS is to prevent lesion growth and scotoma  growth.  2.  Repeat Avastin OS today.  3.  Ophthalmic Meds Ordered this visit:  Meds ordered this encounter  Medications  . Bevacizumab (AVASTIN)  SOLN 1.25 mg       Return in about 8 weeks (around 02/26/2020) for DILATE OU, AVASTIN OCT, OD.  There are no Patient Instructions on file for this visit.   Explained the diagnoses, plan, and follow up with the patient and they expressed understanding.  Patient expressed understanding of the importance of proper follow up care.   Clent Demark Isabellah Sobocinski M.D. Diseases & Surgery of the Retina and Vitreous Retina [facility] Diabetic [facility] @TODAY @     Abbreviations: M myopia (nearsighted); A astigmatism; H hyperopia (farsighted); P presbyopia; Mrx spectacle prescription;  CTL contact lenses; OD right eye; OS left eye; OU both eyes  XT exotropia; ET esotropia; PEK punctate epithelial keratitis; PEE punctate epithelial erosions; DES dry eye syndrome; MGD meibomian gland dysfunction; ATs artificial tears; PFAT's preservative free artificial tears; [place] nuclear sclerotic cataract; PSC posterior subcapsular cataract; ERM epi-retinal membrane; PVD posterior vitreous detachment; RD retinal detachment; DM diabetes mellitus; DR diabetic retinopathy; NPDR non-proliferative diabetic retinopathy; PDR proliferative diabetic retinopathy; CSME clinically significant macular edema; DME diabetic macular edema; dbh dot blot hemorrhages; CWS cotton wool spot; POAG primary open angle glaucoma; C/D cup-to-disc ratio; HVF humphrey visual field; GVF goldmann visual field; OCT optical coherence tomography; IOP intraocular pressure; BRVO Branch retinal vein occlusion; CRVO central retinal vein occlusion; CRAO central retinal artery occlusion; BRAO branch retinal artery occlusion; RT retinal tear; SB scleral buckle; PPV pars plana vitrectomy; VH Vitreous hemorrhage; PRP panretinal laser photocoagulation; IVK intravitreal kenalog; VMT vitreomacular traction; MH Macular hole;  NVD  neovascularization of the disc; NVE neovascularization elsewhere; AREDS age related eye disease study; ARMD age related macular degeneration; POAG primary open angle glaucoma; EBMD epithelial/anterior basement membrane dystrophy; ACIOL anterior chamber intraocular lens; IOL intraocular lens; PCIOL posterior chamber intraocular lens; Phaco/IOL phacoemulsification with intraocular lens placement; [place] photorefractive keratectomy; LASIK laser assisted in situ keratomileusis; HTN hypertension; DM diabetes mellitus; COPD chronic obstructive pulmonary disease

## 2020-01-07 DIAGNOSIS — G4733 Obstructive sleep apnea (adult) (pediatric): Secondary | ICD-10-CM | POA: Diagnosis not present

## 2020-01-15 DIAGNOSIS — N183 Chronic kidney disease, stage 3 unspecified: Secondary | ICD-10-CM | POA: Diagnosis not present

## 2020-01-15 DIAGNOSIS — E7800 Pure hypercholesterolemia, unspecified: Secondary | ICD-10-CM | POA: Diagnosis not present

## 2020-01-15 DIAGNOSIS — E1122 Type 2 diabetes mellitus with diabetic chronic kidney disease: Secondary | ICD-10-CM | POA: Diagnosis not present

## 2020-01-15 DIAGNOSIS — I129 Hypertensive chronic kidney disease with stage 1 through stage 4 chronic kidney disease, or unspecified chronic kidney disease: Secondary | ICD-10-CM | POA: Diagnosis not present

## 2020-01-15 DIAGNOSIS — E1142 Type 2 diabetes mellitus with diabetic polyneuropathy: Secondary | ICD-10-CM | POA: Diagnosis not present

## 2020-01-15 DIAGNOSIS — F4321 Adjustment disorder with depressed mood: Secondary | ICD-10-CM | POA: Diagnosis not present

## 2020-01-15 DIAGNOSIS — I2510 Atherosclerotic heart disease of native coronary artery without angina pectoris: Secondary | ICD-10-CM | POA: Diagnosis not present

## 2020-01-15 DIAGNOSIS — N1832 Chronic kidney disease, stage 3b: Secondary | ICD-10-CM | POA: Diagnosis not present

## 2020-01-16 DIAGNOSIS — I129 Hypertensive chronic kidney disease with stage 1 through stage 4 chronic kidney disease, or unspecified chronic kidney disease: Secondary | ICD-10-CM | POA: Diagnosis not present

## 2020-02-11 DIAGNOSIS — E7800 Pure hypercholesterolemia, unspecified: Secondary | ICD-10-CM | POA: Diagnosis not present

## 2020-02-11 DIAGNOSIS — N183 Chronic kidney disease, stage 3 unspecified: Secondary | ICD-10-CM | POA: Diagnosis not present

## 2020-02-11 DIAGNOSIS — I2510 Atherosclerotic heart disease of native coronary artery without angina pectoris: Secondary | ICD-10-CM | POA: Diagnosis not present

## 2020-02-11 DIAGNOSIS — E1122 Type 2 diabetes mellitus with diabetic chronic kidney disease: Secondary | ICD-10-CM | POA: Diagnosis not present

## 2020-02-11 DIAGNOSIS — E1142 Type 2 diabetes mellitus with diabetic polyneuropathy: Secondary | ICD-10-CM | POA: Diagnosis not present

## 2020-02-11 DIAGNOSIS — N1832 Chronic kidney disease, stage 3b: Secondary | ICD-10-CM | POA: Diagnosis not present

## 2020-02-11 DIAGNOSIS — F4321 Adjustment disorder with depressed mood: Secondary | ICD-10-CM | POA: Diagnosis not present

## 2020-02-11 DIAGNOSIS — I129 Hypertensive chronic kidney disease with stage 1 through stage 4 chronic kidney disease, or unspecified chronic kidney disease: Secondary | ICD-10-CM | POA: Diagnosis not present

## 2020-02-12 DIAGNOSIS — G4733 Obstructive sleep apnea (adult) (pediatric): Secondary | ICD-10-CM | POA: Diagnosis not present

## 2020-02-14 DIAGNOSIS — I129 Hypertensive chronic kidney disease with stage 1 through stage 4 chronic kidney disease, or unspecified chronic kidney disease: Secondary | ICD-10-CM | POA: Diagnosis not present

## 2020-02-19 DIAGNOSIS — E1122 Type 2 diabetes mellitus with diabetic chronic kidney disease: Secondary | ICD-10-CM | POA: Diagnosis not present

## 2020-02-19 DIAGNOSIS — N1832 Chronic kidney disease, stage 3b: Secondary | ICD-10-CM | POA: Diagnosis not present

## 2020-02-19 DIAGNOSIS — Z7984 Long term (current) use of oral hypoglycemic drugs: Secondary | ICD-10-CM | POA: Diagnosis not present

## 2020-02-19 DIAGNOSIS — I129 Hypertensive chronic kidney disease with stage 1 through stage 4 chronic kidney disease, or unspecified chronic kidney disease: Secondary | ICD-10-CM | POA: Diagnosis not present

## 2020-02-19 DIAGNOSIS — G4733 Obstructive sleep apnea (adult) (pediatric): Secondary | ICD-10-CM | POA: Diagnosis not present

## 2020-02-19 DIAGNOSIS — E1142 Type 2 diabetes mellitus with diabetic polyneuropathy: Secondary | ICD-10-CM | POA: Diagnosis not present

## 2020-03-02 ENCOUNTER — Encounter (INDEPENDENT_AMBULATORY_CARE_PROVIDER_SITE_OTHER): Payer: Self-pay | Admitting: Ophthalmology

## 2020-03-02 ENCOUNTER — Other Ambulatory Visit: Payer: Self-pay

## 2020-03-02 ENCOUNTER — Ambulatory Visit (INDEPENDENT_AMBULATORY_CARE_PROVIDER_SITE_OTHER): Payer: PPO | Admitting: Ophthalmology

## 2020-03-02 DIAGNOSIS — H353221 Exudative age-related macular degeneration, left eye, with active choroidal neovascularization: Secondary | ICD-10-CM | POA: Diagnosis not present

## 2020-03-02 MED ORDER — BEVACIZUMAB CHEMO INJECTION 1.25MG/0.05ML SYRINGE FOR KALEIDOSCOPE
1.2500 mg | INTRAVITREAL | Status: AC | PRN
  Administered 2020-03-02: 1.25 mg via INTRAVITREAL

## 2020-03-02 NOTE — Progress Notes (Signed)
03/02/2020     CHIEF COMPLAINT Patient presents for Retina Follow Up   HISTORY OF PRESENT ILLNESS: Jimmy Butler is a 86 y.o. male who presents to the clinic today for:   HPI    Retina Follow Up    Patient presents with  Wet AMD.  In both eyes.  Duration of 8 weeks.  Since onset it is stable.          Comments    8 week follow up - OCT OU, Poss Avastin OD Patient denies change in vision and overall has no complaints.       Last edited by Gerda Diss on 03/02/2020  8:43 AM. (History)      Referring physician: Josetta Huddle, MD [address],  [place] [address]  HISTORICAL INFORMATION:   Selected notes from the [facility]: No current outpatient medications on file. (Ophthalmic Drugs)   No current facility-administered medications for this visit. (Ophthalmic Drugs)   Current Outpatient Medications (Other)  Medication Sig  . aspirin 325 MG tablet Take 325 mg by mouth daily.    . clopidogrel (PLAVIX) 75 MG tablet Take 75 mg by mouth daily.    Marland Kitchen lisinopril-hydrochlorothiazide (PRINZIDE,ZESTORETIC) 20-12.5 MG per tablet Take 1 tablet by mouth daily.    . metoprolol tartrate (LOPRESSOR) 25 MG tablet Take 25 mg by mouth 2 (two) times daily.  . Misc Natural Products (GLUCOSAMINE CHONDROITIN ADV PO) Take 1 tablet by mouth 2 (two) times daily.   . Multiple Vitamins-Minerals (EYE VITAMINS PO) Take 1 tablet by mouth daily.   . rosuvastatin (CRESTOR) 10 MG tablet Take 5 mg by mouth daily.  . vitamin B-12 (CYANOCOBALAMIN) 1000 MCG tablet Take 1,000 mcg by mouth daily.     No current facility-administered medications for this visit. (Other)      REVIEW OF SYSTEMS:    ALLERGIES No Known Allergies  PAST MEDICAL HISTORY Past Medical History:  Diagnosis Date  . Diabetes mellitus without complication ([place])    History reviewed. No pertinent surgical history.  FAMILY HISTORY Family History  Problem Relation  Age of Onset  . Heart disease Father     SOCIAL HISTORY Social History   Tobacco Use  . Smoking status: Former Smoker    Packs/day: 1.50    Years: 40.00    Pack years: 60.00    Quit date: 09/19/1968    Years since quitting: 51.4  . Smokeless tobacco: Never Used  Substance Use Topics  . Alcohol use: No  . Drug use: No         OPHTHALMIC EXAM:  Base Eye Exam    Visual Acuity (Snellen - Linear)      Right Left   Dist [place] CF @ 5' 20/200   Dist ph [place] NI NI       Tonometry (Tonopen, 8:46 AM)      Right Left   Pressure 19 16       Visual Fields (Counting fingers)      Left Right    Full Full       Neuro/Psych    Oriented x3: Yes   Mood/Affect: Normal       Dilation    Both eyes: 1.0% Mydriacyl, 2.5% Phenylephrine @ 8:46 AM        Slit Lamp and Fundus Exam    External Exam      Right Left   External Normal Normal  Slit Lamp Exam      Right Left   Lids/Lashes Normal Normal   Conjunctiva/Sclera White and quiet White and quiet   Cornea Clear Clear   Anterior Chamber Deep and quiet Deep and quiet   Iris Round and reactive Round and reactive   Lens Posterior chamber intraocular lens Posterior chamber intraocular lens   Anterior Vitreous Normal Normal       Fundus Exam      Right Left   Posterior Vitreous Posterior vitreous detachment Posterior vitreous detachment   Disc Normal Normal   C/D Ratio 0.2 0.2   Macula Disciform scar,  Drusen, Disciform scar, large with pigmentary ring.  No active subretinal hemorrhage or fluid peripherally.   Vessels Normal Normal   Periphery Normal Normal          IMAGING AND PROCEDURES  Imaging and Procedures for 03/02/20  OCT, Retina - OU - Both Eyes       Right Eye Quality was good. Scan locations included subfoveal. Central Foveal Thickness: 608. Progression has been stable. Findings include subretinal scarring, cystoid macular edema.   Left Eye Quality was good. Scan locations included subfoveal.  Central Foveal Thickness: 445. Progression has improved. Findings include cystoid macular edema, disciform scar.   Notes OS with subretinal choroidal neovascular membrane, disciform scar but no areas of active CME nor lesion growth peripherally       Intravitreal Injection, Pharmacologic Agent - OS - Left Eye       Time Out 03/02/2020. 9:49 AM. Confirmed correct patient, procedure, site, and patient consented.   Anesthesia Topical anesthesia was used. Anesthetic medications included Akten 3.5%.   Procedure Preparation included Ofloxacin , 10% betadine to eyelids. A 30 gauge needle was used.   Injection:  1.25 mg Bevacizumab (AVASTIN) SOLN   NDC: [address]-0, Lot: [id number]   Route: Intravitreal, Site: Left Eye, Waste: 0 mg  Post-op Post injection exam found visual acuity of at least counting fingers. The patient tolerated the procedure well. There were no complications. The patient received written and verbal post procedure care education. Post injection medications were not given.                 ASSESSMENT/PLAN:  No problem-specific Assessment & Plan notes found for this encounter.      ICD-10-CM   1. Exudative age-related macular degeneration of left eye with active choroidal neovascularization (HCC)  H35.3221 OCT, Retina - OU - Both Eyes    Intravitreal Injection, Pharmacologic Agent - OS - Left Eye    Bevacizumab (AVASTIN) SOLN 1.25 mg    1.  Repeat intravitreal Avastin OS today to prevent lesion growth and preserve ambulatory vision at 20/200  2.  Repeat examination OS in 8 weeks  3.  Ophthalmic Meds Ordered this visit:  Meds ordered this encounter  Medications  . Bevacizumab (AVASTIN) SOLN 1.25 mg       Return in about 8 weeks (around 04/27/2020) for AVASTIN OCT, dilate, OS.  There are no Patient Instructions on file for this visit.   Explained the diagnoses, plan, and follow up with the patient and they expressed understanding.  Patient expressed  understanding of the importance of proper follow up care.   Clent Demark Broedy Osbourne M.D. Diseases & Surgery of the Retina and Vitreous Retina & Diabetic [facility] 03/02/20     Abbreviations: M myopia (nearsighted); A astigmatism; H hyperopia (farsighted); P presbyopia; Mrx spectacle prescription;  CTL contact lenses; OD right eye; OS left eye; OU both eyes  XT exotropia; ET esotropia; PEK punctate epithelial keratitis; PEE punctate epithelial erosions; DES dry eye syndrome; MGD meibomian gland dysfunction; ATs artificial tears; PFAT's preservative free artificial tears; [place] nuclear sclerotic cataract; PSC posterior subcapsular cataract; ERM epi-retinal membrane; PVD posterior vitreous detachment; RD retinal detachment; DM diabetes mellitus; DR diabetic retinopathy; NPDR non-proliferative diabetic retinopathy; PDR proliferative diabetic retinopathy; CSME clinically significant macular edema; DME diabetic macular edema; dbh dot blot hemorrhages; CWS cotton wool spot; POAG primary open angle glaucoma; C/D cup-to-disc ratio; HVF humphrey visual field; GVF goldmann visual field; OCT optical coherence tomography; IOP intraocular pressure; BRVO Branch retinal vein occlusion; CRVO central retinal vein occlusion; CRAO central retinal artery occlusion; BRAO branch retinal artery occlusion; RT retinal tear; SB scleral buckle; PPV pars plana vitrectomy; VH Vitreous hemorrhage; PRP panretinal laser photocoagulation; IVK intravitreal kenalog; VMT vitreomacular traction; MH Macular hole;  NVD neovascularization of the disc; NVE neovascularization elsewhere; AREDS age related eye disease study; ARMD age related macular degeneration; POAG primary open angle glaucoma; EBMD epithelial/anterior basement membrane dystrophy; ACIOL anterior chamber intraocular lens; IOL intraocular lens; PCIOL posterior chamber intraocular lens; Phaco/IOL phacoemulsification with intraocular lens placement; [place] photorefractive keratectomy; LASIK laser  assisted in situ keratomileusis; HTN hypertension; DM diabetes mellitus; COPD chronic obstructive pulmonary disease

## 2020-03-13 DIAGNOSIS — G4733 Obstructive sleep apnea (adult) (pediatric): Secondary | ICD-10-CM | POA: Diagnosis not present

## 2020-03-18 DIAGNOSIS — I129 Hypertensive chronic kidney disease with stage 1 through stage 4 chronic kidney disease, or unspecified chronic kidney disease: Secondary | ICD-10-CM | POA: Diagnosis not present

## 2020-03-20 DIAGNOSIS — G4733 Obstructive sleep apnea (adult) (pediatric): Secondary | ICD-10-CM | POA: Diagnosis not present

## 2020-04-12 DIAGNOSIS — G4733 Obstructive sleep apnea (adult) (pediatric): Secondary | ICD-10-CM | POA: Diagnosis not present

## 2020-04-16 DIAGNOSIS — I129 Hypertensive chronic kidney disease with stage 1 through stage 4 chronic kidney disease, or unspecified chronic kidney disease: Secondary | ICD-10-CM | POA: Diagnosis not present

## 2020-04-17 DIAGNOSIS — I129 Hypertensive chronic kidney disease with stage 1 through stage 4 chronic kidney disease, or unspecified chronic kidney disease: Secondary | ICD-10-CM | POA: Diagnosis not present

## 2020-04-20 DIAGNOSIS — G4733 Obstructive sleep apnea (adult) (pediatric): Secondary | ICD-10-CM | POA: Diagnosis not present

## 2020-04-24 DIAGNOSIS — E1142 Type 2 diabetes mellitus with diabetic polyneuropathy: Secondary | ICD-10-CM | POA: Diagnosis not present

## 2020-04-24 DIAGNOSIS — E1122 Type 2 diabetes mellitus with diabetic chronic kidney disease: Secondary | ICD-10-CM | POA: Diagnosis not present

## 2020-04-24 DIAGNOSIS — I2510 Atherosclerotic heart disease of native coronary artery without angina pectoris: Secondary | ICD-10-CM | POA: Diagnosis not present

## 2020-04-24 DIAGNOSIS — I129 Hypertensive chronic kidney disease with stage 1 through stage 4 chronic kidney disease, or unspecified chronic kidney disease: Secondary | ICD-10-CM | POA: Diagnosis not present

## 2020-04-24 DIAGNOSIS — N1832 Chronic kidney disease, stage 3b: Secondary | ICD-10-CM | POA: Diagnosis not present

## 2020-04-24 DIAGNOSIS — F4321 Adjustment disorder with depressed mood: Secondary | ICD-10-CM | POA: Diagnosis not present

## 2020-04-24 DIAGNOSIS — E7800 Pure hypercholesterolemia, unspecified: Secondary | ICD-10-CM | POA: Diagnosis not present

## 2020-04-24 DIAGNOSIS — N183 Chronic kidney disease, stage 3 unspecified: Secondary | ICD-10-CM | POA: Diagnosis not present

## 2020-05-04 ENCOUNTER — Ambulatory Visit (INDEPENDENT_AMBULATORY_CARE_PROVIDER_SITE_OTHER): Payer: PPO | Admitting: Ophthalmology

## 2020-05-04 ENCOUNTER — Encounter (INDEPENDENT_AMBULATORY_CARE_PROVIDER_SITE_OTHER): Payer: Self-pay | Admitting: Ophthalmology

## 2020-05-04 ENCOUNTER — Other Ambulatory Visit: Payer: Self-pay

## 2020-05-04 DIAGNOSIS — H353221 Exudative age-related macular degeneration, left eye, with active choroidal neovascularization: Secondary | ICD-10-CM

## 2020-05-04 MED ORDER — BEVACIZUMAB CHEMO INJECTION 1.25MG/0.05ML SYRINGE FOR KALEIDOSCOPE
1.2500 mg | INTRAVITREAL | Status: AC | PRN
  Administered 2020-05-04: 1.25 mg via INTRAVITREAL

## 2020-05-04 NOTE — Progress Notes (Signed)
05/04/2020     CHIEF COMPLAINT Patient presents for Retina Follow Up   HISTORY OF PRESENT ILLNESS: Jimmy Butler is a 86 y.o. male who presents to the clinic today for:   HPI    Retina Follow Up    Patient presents with  Wet AMD.  In left eye.  Severity is moderate.  Duration of 2 months.  Since onset it is stable.  I, the attending physician,  performed the HPI with the patient and updated documentation appropriately.          Comments    2 Month Wet AMD f\u OS. Possible Avastin OS. Oct  Pt feels vision has decreased. Denies any other complaints.       Last edited by Tilda Franco on 05/04/2020  8:23 AM. (History)      Referring physician: Josetta Huddle, MD [address],  [place] [id number]  HISTORICAL INFORMATION:   Selected notes from the [facility]: No current outpatient medications on file. (Ophthalmic Drugs)   No current facility-administered medications for this visit. (Ophthalmic Drugs)   Current Outpatient Medications (Other)  Medication Sig  . aspirin 325 MG tablet Take 325 mg by mouth daily.    . clopidogrel (PLAVIX) 75 MG tablet Take 75 mg by mouth daily.    Marland Kitchen lisinopril-hydrochlorothiazide (PRINZIDE,ZESTORETIC) 20-12.5 MG per tablet Take 1 tablet by mouth daily.    . metoprolol tartrate (LOPRESSOR) 25 MG tablet Take 25 mg by mouth 2 (two) times daily.  . Misc Natural Products (GLUCOSAMINE CHONDROITIN ADV PO) Take 1 tablet by mouth 2 (two) times daily.   . Multiple Vitamins-Minerals (EYE VITAMINS PO) Take 1 tablet by mouth daily.   . rosuvastatin (CRESTOR) 10 MG tablet Take 5 mg by mouth daily.  . vitamin B-12 (CYANOCOBALAMIN) 1000 MCG tablet Take 1,000 mcg by mouth daily.     No current facility-administered medications for this visit. (Other)      REVIEW OF SYSTEMS:    ALLERGIES No Known Allergies  PAST MEDICAL HISTORY Past Medical History:  Diagnosis Date  . Diabetes  mellitus without complication ([place])    History reviewed. No pertinent surgical history.  FAMILY HISTORY Family History  Problem Relation Age of Onset  . Heart disease Father     SOCIAL HISTORY Social History   Tobacco Use  . Smoking status: Former Smoker    Packs/day: 1.50    Years: 40.00    Pack years: 60.00    Quit date: 09/19/1968    Years since quitting: 51.6  . Smokeless tobacco: Never Used  Substance Use Topics  . Alcohol use: No  . Drug use: No         OPHTHALMIC EXAM:  Base Eye Exam    Visual Acuity (Snellen - Linear)      Right Left   Dist [place] 20/400 20/200 -1       Tonometry (Tonopen, 8:28 AM)      Right Left   Pressure 13 16       Pupils      Dark Light Shape React APD   Right 3 2 Round Slow None   Left 3 2 Round Brisk None       Visual Fields (Counting fingers)      Left Right    Full Full       Neuro/Psych    Oriented x3: Yes   Mood/Affect: Normal  Dilation    Left eye: 1.0% Mydriacyl, 2.5% Phenylephrine @ 8:28 AM        Slit Lamp and Fundus Exam    External Exam      Right Left   External Normal Normal       Slit Lamp Exam      Right Left   Lids/Lashes Normal Normal   Conjunctiva/Sclera White and quiet White and quiet   Cornea Clear Clear   Anterior Chamber Deep and quiet Deep and quiet   Iris Round and reactive Round and reactive   Lens Posterior chamber intraocular lens Posterior chamber intraocular lens   Anterior Vitreous Normal Normal       Fundus Exam      Right Left   Posterior Vitreous  Posterior vitreous detachment   Disc  Normal   C/D Ratio  0.2   Macula  [place], Disciform scar, large with pigmentary ring.  No active subretinal hemorrhage or fluid peripherally.   Vessels  Normal   Periphery  Normal          IMAGING AND PROCEDURES  Imaging and Procedures for 05/04/20  OCT, Retina - OU - Both Eyes       Right Eye Quality was good. Scan locations included subfoveal. Progression has been stable.  Findings include disciform scar.   Left Eye Quality was good. Scan locations included subfoveal. Central Foveal Thickness: 398. Progression has improved. Findings include abnormal foveal contour, disciform scar.   Notes No enlargement of disciform scarring process left eye with much less intraretinal fluid, 68-month status post intravitreal Avastin, repeat today       Intravitreal Injection, Pharmacologic Agent - OS - Left Eye       Time Out 05/04/2020. 9:24 AM. Confirmed correct patient, procedure, site, and patient consented.   Anesthesia Topical anesthesia was used. Anesthetic medications included Akten 3.5%.   Procedure Preparation included Ofloxacin , Tobramycin 0.3%, 10% betadine to eyelids, 5% betadine to ocular surface. A 30 gauge needle was used.   Injection:  1.25 mg Bevacizumab (AVASTIN) SOLN   NDC: [address]-8   Route: Intravitreal, Site: Left Eye, Waste: 0 mg  Post-op Post injection exam found visual acuity of at least counting fingers. The patient tolerated the procedure well. There were no complications. The patient received written and verbal post procedure care education. Post injection medications were not given.                 ASSESSMENT/PLAN:  Exudative age-related macular degeneration of left eye with active choroidal neovascularization (HCC) The nature of wet macular degeneration was discussed with the patient.  Forms of therapy reviewed include the use of Anti-VEGF medications injected painlessly into the eye, as well as other possible treatment modalities, including thermal laser therapy. Fellow eye involvement and risks were discussed with the patient. Upon the finding of wet age related macular degeneration, treatment will be offered. The treatment regimen is on a treat as needed basis with the intent to treat if necessary and extend interval of exams when possible. On average 1 out of 6 patients do not need lifetime therapy. However, the risk of  recurrent disease is high for a lifetime.  Initially monthly, then periodic, examinations and evaluations will determine whether the next treatment is required on the day of the examination.  To maintain size of scotoma and prevent enlargement, repeat intravitreal Avastin OS today and examination in 9 weeks      ICD-10-CM   1. Exudative age-related macular degeneration of  left eye with active choroidal neovascularization (HCC)  H35.3221 OCT, Retina - OU - Both Eyes    Intravitreal Injection, Pharmacologic Agent - OS - Left Eye    Bevacizumab (AVASTIN) SOLN 1.25 mg    1.  Patient to notify the office should new visual acuity difficulties or declines develop  2.  3.  Ophthalmic Meds Ordered this visit:  Meds ordered this encounter  Medications  . Bevacizumab (AVASTIN) SOLN 1.25 mg       Return in about 9 weeks (around 07/06/2020) for DILATE OU, AVASTIN OCT, OS.  Patient Instructions  Age-Related Macular Degeneration  Age-related macular degeneration (AMD) is an eye disease related to aging. The disease causes a loss of central vision. Central vision allows a person to see objects clearly and do daily tasks like reading and driving. There are two main types of AMD:  Dry AMD. People with this type generally lose their vision slowly. This is the most common type of AMD. Some people with dry AMD notice very little change in their vision as they age.  Wet AMD. People with this type can lose their vision quickly. What are the causes? This condition is caused by damage to the part of the eye that provides you with central vision (macula).  Dry AMD happens when deposits in the macula cause light-sensitive cells to slowly break down.  Wet AMD happens when abnormal blood vessels grow under the macula and leak blood and fluid. What increases the risk? You are more likely to develop this condition if you:  Are 5 years old or older, and especially 10 years old or  older.  Smoke.  Are obese.  Have a family history of AMD.  Have high cholesterol, high blood pressure, or heart disease.  Have been exposed to high levels of ultraviolet (UV) light and blue light.  Are white (Caucasian).  Are male. What are the signs or symptoms? Common symptoms of this condition include:  Blurred vision, especially when reading print material. The blurred vision often improves in brighter light.  A blurred or blind spot in the center of your field of vision that is small but growing larger.  Bright colors seeming less bright than they used to be.  Decreased ability to recognize and see faces.  One eye seeing worse than the other.  Decreased ability to adapt to dimly lit rooms.  Straight lines appearing crooked or wavy. How is this diagnosed? This condition is diagnosed based on your symptoms and an eye exam. During the eye exam:  Eye drops will be placed into your eyes to enlarge (dilate) your pupils. This will allow your health care provider to see the back of your eye.  You may be asked to look at an image that looks like a checkerboard (Amsler grid). Early changes in your central vision may cause the grid to appear distorted. After the exam, you may be given one or both of these tests:  Fluorescein angiogram. This test determines whether you have dry or wet AMD.  Optical coherence tomography (OCT) test to evaluate deep layers of the retina. How is this treated? There is no cure for this condition, but treatment can help to slow down progression of the disease. This condition may be treated with:  Supplements, including vitamin C, vitamin E, beta carotene, and zinc.  Laser surgery to destroy new blood vessels or leaking blood vessels in your eye.  Injections of medicines into your eye to slow down the formation of abnormal blood  vessels that may leak. These injections may need to be repeated on a routine basis. Follow these instructions at  home:  Take over-the-counter and prescription medicines only as told by your health care provider.  Take vitamins and supplements as told by your health care provider.  Ask your health care provider for an Amsler grid. Use it every day to check each eye for vision changes.  Get an eye exam as often as told by your health care provider. Make sure to get an eye exam at least once every year.  Keep all follow-up visits as told by your health care provider. This is important. Contact a health care provider if:  You notice any new changes in your vision. Get help right away if:  You suddenly lose vision or develop pain in the eye. Summary  Age-related macular degeneration (AMD) is an eye disease related to aging. There are two types of this condition: dry AMD and wet AMD.  This condition is caused by damage to the part of the eye that provides you with central vision (macula).  Once diagnosed with AMD, make sure to get an eye exam every year, take supplements and vitamins as directed, use an Amsler grid at home, and follow up with your health care provider. This information is not intended to replace advice given to you by your health care provider. Make sure you discuss any questions you have with your health care provider. Document Revised: 03/14/2018 Document Reviewed: 03/14/2018 Elsevier Patient Education  [place] the diagnoses, plan, and follow up with the patient and they expressed understanding.  Patient expressed understanding of the importance of proper follow up care.   Clent Demark Makaela Cando M.D. Diseases & Surgery of the Retina and Vitreous [facility] [place] 05/04/20     Abbreviations: M myopia (nearsighted); A astigmatism; H hyperopia (farsighted); P presbyopia; Mrx spectacle prescription;  CTL contact lenses; OD right eye; OS left eye; OU both eyes  XT exotropia; ET esotropia; PEK punctate epithelial keratitis; PEE punctate epithelial  erosions; DES dry eye syndrome; MGD meibomian gland dysfunction; ATs artificial tears; PFAT's preservative free artificial tears; [place] nuclear sclerotic cataract; PSC posterior subcapsular cataract; ERM epi-retinal membrane; PVD posterior vitreous detachment; RD retinal detachment; DM diabetes mellitus; DR diabetic retinopathy; NPDR non-proliferative diabetic retinopathy; PDR proliferative diabetic retinopathy; CSME clinically significant macular edema; DME diabetic macular edema; dbh dot blot hemorrhages; CWS cotton wool spot; POAG primary open angle glaucoma; C/D cup-to-disc ratio; HVF humphrey visual field; GVF goldmann visual field; OCT optical coherence tomography; IOP intraocular pressure; BRVO Branch retinal vein occlusion; CRVO central retinal vein occlusion; CRAO central retinal artery occlusion; BRAO branch retinal artery occlusion; RT retinal tear; SB scleral buckle; PPV pars plana vitrectomy; VH Vitreous hemorrhage; PRP panretinal laser photocoagulation; IVK intravitreal kenalog; VMT vitreomacular traction; MH Macular hole;  NVD neovascularization of the disc; NVE neovascularization elsewhere; AREDS age related eye disease study; ARMD age related macular degeneration; POAG primary open angle glaucoma; EBMD epithelial/anterior basement membrane dystrophy; ACIOL anterior chamber intraocular lens; IOL intraocular lens; PCIOL posterior chamber intraocular lens; Phaco/IOL phacoemulsification with intraocular lens placement; [place] photorefractive keratectomy; LASIK laser assisted in situ keratomileusis; HTN hypertension; DM diabetes mellitus; COPD chronic obstructive pulmonary disease

## 2020-05-04 NOTE — Patient Instructions (Signed)
Age-Related Macular Degeneration  Age-related macular degeneration (AMD) is an eye disease related to aging. The disease causes a loss of central vision. Central vision allows a person to see objects clearly and do daily tasks like reading and driving. There are two main types of AMD:  Dry AMD. People with this type generally lose their vision slowly. This is the most common type of AMD. Some people with dry AMD notice very little change in their vision as they age.  Wet AMD. People with this type can lose their vision quickly. What are the causes? This condition is caused by damage to the part of the eye that provides you with central vision (macula).  Dry AMD happens when deposits in the macula cause light-sensitive cells to slowly break down.  Wet AMD happens when abnormal blood vessels grow under the macula and leak blood and fluid. What increases the risk? You are more likely to develop this condition if you:  Are 50 years old or older, and especially 75 years old or older.  Smoke.  Are obese.  Have a family history of AMD.  Have high cholesterol, high blood pressure, or heart disease.  Have been exposed to high levels of ultraviolet (UV) light and blue light.  Are white (Caucasian).  Are male. What are the signs or symptoms? Common symptoms of this condition include:  Blurred vision, especially when reading print material. The blurred vision often improves in brighter light.  A blurred or blind spot in the center of your field of vision that is small but growing larger.  Bright colors seeming less bright than they used to be.  Decreased ability to recognize and see faces.  One eye seeing worse than the other.  Decreased ability to adapt to dimly lit rooms.  Straight lines appearing crooked or wavy. How is this diagnosed? This condition is diagnosed based on your symptoms and an eye exam. During the eye exam:  Eye drops will be placed into your eyes to  enlarge (dilate) your pupils. This will allow your health care provider to see the back of your eye.  You may be asked to look at an image that looks like a checkerboard (Amsler grid). Early changes in your central vision may cause the grid to appear distorted. After the exam, you may be given one or both of these tests:  Fluorescein angiogram. This test determines whether you have dry or wet AMD.  Optical coherence tomography (OCT) test to evaluate deep layers of the retina. How is this treated? There is no cure for this condition, but treatment can help to slow down progression of the disease. This condition may be treated with:  Supplements, including vitamin C, vitamin E, beta carotene, and zinc.  Laser surgery to destroy new blood vessels or leaking blood vessels in your eye.  Injections of medicines into your eye to slow down the formation of abnormal blood vessels that may leak. These injections may need to be repeated on a routine basis. Follow these instructions at home:  Take over-the-counter and prescription medicines only as told by your health care provider.  Take vitamins and supplements as told by your health care provider.  Ask your health care provider for an Amsler grid. Use it every day to check each eye for vision changes.  Get an eye exam as often as told by your health care provider. Make sure to get an eye exam at least once every year.  Keep all follow-up visits as told by   your health care provider. This is important. Contact a health care provider if:  You notice any new changes in your vision. Get help right away if:  You suddenly lose vision or develop pain in the eye. Summary  Age-related macular degeneration (AMD) is an eye disease related to aging. There are two types of this condition: dry AMD and wet AMD.  This condition is caused by damage to the part of the eye that provides you with central vision (macula).  Once diagnosed with AMD, make sure  to get an eye exam every year, take supplements and vitamins as directed, use an Amsler grid at home, and follow up with your health care provider. This information is not intended to replace advice given to you by your health care provider. Make sure you discuss any questions you have with your health care provider. Document Revised: 03/14/2018 Document Reviewed: 03/14/2018 Elsevier Patient Education  2020 [employer]  

## 2020-05-04 NOTE — Assessment & Plan Note (Addendum)
The nature of wet macular degeneration was discussed with the patient.  Forms of therapy reviewed include the use of Anti-VEGF medications injected painlessly into the eye, as well as other possible treatment modalities, including thermal laser therapy. Fellow eye involvement and risks were discussed with the patient. Upon the finding of wet age related macular degeneration, treatment will be offered. The treatment regimen is on a treat as needed basis with the intent to treat if necessary and extend interval of exams when possible. On average 1 out of 6 patients do not need lifetime therapy. However, the risk of recurrent disease is high for a lifetime.  Initially monthly, then periodic, examinations and evaluations will determine whether the next treatment is required on the day of the examination.  To maintain size of scotoma and prevent enlargement, repeat intravitreal Avastin OS today and examination in 9 weeks

## 2020-05-13 DIAGNOSIS — G4733 Obstructive sleep apnea (adult) (pediatric): Secondary | ICD-10-CM | POA: Diagnosis not present

## 2020-05-18 DIAGNOSIS — I129 Hypertensive chronic kidney disease with stage 1 through stage 4 chronic kidney disease, or unspecified chronic kidney disease: Secondary | ICD-10-CM | POA: Diagnosis not present

## 2020-05-19 DIAGNOSIS — I129 Hypertensive chronic kidney disease with stage 1 through stage 4 chronic kidney disease, or unspecified chronic kidney disease: Secondary | ICD-10-CM | POA: Diagnosis not present

## 2020-05-29 DIAGNOSIS — I10 Essential (primary) hypertension: Secondary | ICD-10-CM | POA: Diagnosis not present

## 2020-05-29 DIAGNOSIS — I129 Hypertensive chronic kidney disease with stage 1 through stage 4 chronic kidney disease, or unspecified chronic kidney disease: Secondary | ICD-10-CM | POA: Diagnosis not present

## 2020-05-29 DIAGNOSIS — E1142 Type 2 diabetes mellitus with diabetic polyneuropathy: Secondary | ICD-10-CM | POA: Diagnosis not present

## 2020-05-29 DIAGNOSIS — I2510 Atherosclerotic heart disease of native coronary artery without angina pectoris: Secondary | ICD-10-CM | POA: Diagnosis not present

## 2020-05-29 DIAGNOSIS — E1122 Type 2 diabetes mellitus with diabetic chronic kidney disease: Secondary | ICD-10-CM | POA: Diagnosis not present

## 2020-05-29 DIAGNOSIS — N183 Chronic kidney disease, stage 3 unspecified: Secondary | ICD-10-CM | POA: Diagnosis not present

## 2020-05-29 DIAGNOSIS — N1832 Chronic kidney disease, stage 3b: Secondary | ICD-10-CM | POA: Diagnosis not present

## 2020-05-29 DIAGNOSIS — E7800 Pure hypercholesterolemia, unspecified: Secondary | ICD-10-CM | POA: Diagnosis not present

## 2020-05-29 DIAGNOSIS — F4321 Adjustment disorder with depressed mood: Secondary | ICD-10-CM | POA: Diagnosis not present

## 2020-06-03 DIAGNOSIS — G4733 Obstructive sleep apnea (adult) (pediatric): Secondary | ICD-10-CM | POA: Diagnosis not present

## 2020-06-13 DIAGNOSIS — G4733 Obstructive sleep apnea (adult) (pediatric): Secondary | ICD-10-CM | POA: Diagnosis not present

## 2020-06-18 DIAGNOSIS — I10 Essential (primary) hypertension: Secondary | ICD-10-CM | POA: Diagnosis not present

## 2020-07-03 DIAGNOSIS — G4733 Obstructive sleep apnea (adult) (pediatric): Secondary | ICD-10-CM | POA: Diagnosis not present

## 2020-07-07 ENCOUNTER — Ambulatory Visit (INDEPENDENT_AMBULATORY_CARE_PROVIDER_SITE_OTHER): Payer: PPO | Admitting: Ophthalmology

## 2020-07-07 ENCOUNTER — Other Ambulatory Visit: Payer: Self-pay

## 2020-07-07 ENCOUNTER — Encounter (INDEPENDENT_AMBULATORY_CARE_PROVIDER_SITE_OTHER): Payer: Self-pay | Admitting: Ophthalmology

## 2020-07-07 DIAGNOSIS — H353221 Exudative age-related macular degeneration, left eye, with active choroidal neovascularization: Secondary | ICD-10-CM | POA: Diagnosis not present

## 2020-07-07 DIAGNOSIS — H353212 Exudative age-related macular degeneration, right eye, with inactive choroidal neovascularization: Secondary | ICD-10-CM

## 2020-07-07 MED ORDER — BEVACIZUMAB CHEMO INJECTION 1.25MG/0.05ML SYRINGE FOR KALEIDOSCOPE
1.2500 mg | INTRAVITREAL | Status: AC | PRN
  Administered 2020-07-07: 1.25 mg via INTRAVITREAL

## 2020-07-07 NOTE — Patient Instructions (Signed)
Patient instructed to promptly report any new onset visual acuity declines or distortions

## 2020-07-07 NOTE — Progress Notes (Signed)
07/07/2020     CHIEF COMPLAINT Patient presents for Retina Follow Up   HISTORY OF PRESENT ILLNESS: Jimmy Butler is a 86 y.o. male who presents to the clinic today for:   HPI    Retina Follow Up    Patient presents with  Wet AMD.  In left eye.  This started 9 weeks ago.  Severity is mild.  Duration of 9 weeks.  Since onset it is stable.          Comments    9 Week AMD F/U OU, poss Avastin OS  Pt denies noticeable changes to [place] OU since last visit. Pt denies ocular pain, flashes of light, or floaters OU.         Last edited by Rockie Neighbours, [place] on 07/07/2020  8:06 AM. (History)      Referring physician: Josetta Huddle, MD [address],  [place] [address]  HISTORICAL INFORMATION:   Selected notes from the [facility]: No current outpatient medications on file. (Ophthalmic Drugs)   No current facility-administered medications for this visit. (Ophthalmic Drugs)   Current Outpatient Medications (Other)  Medication Sig  . aspirin 325 MG tablet Take 325 mg by mouth daily.    . clopidogrel (PLAVIX) 75 MG tablet Take 75 mg by mouth daily.    Marland Kitchen lisinopril-hydrochlorothiazide (PRINZIDE,ZESTORETIC) 20-12.5 MG per tablet Take 1 tablet by mouth daily.    . metoprolol tartrate (LOPRESSOR) 25 MG tablet Take 25 mg by mouth 2 (two) times daily.  . Misc Natural Products (GLUCOSAMINE CHONDROITIN ADV PO) Take 1 tablet by mouth 2 (two) times daily.   . Multiple Vitamins-Minerals (EYE VITAMINS PO) Take 1 tablet by mouth daily.   . rosuvastatin (CRESTOR) 10 MG tablet Take 5 mg by mouth daily.  . vitamin B-12 (CYANOCOBALAMIN) 1000 MCG tablet Take 1,000 mcg by mouth daily.     No current facility-administered medications for this visit. (Other)      REVIEW OF SYSTEMS:    ALLERGIES No Known Allergies  PAST MEDICAL HISTORY Past Medical History:  Diagnosis Date  . Diabetes mellitus without complication ([place])     History reviewed. No pertinent surgical history.  FAMILY HISTORY Family History  Problem Relation Age of Onset  . Heart disease Father     SOCIAL HISTORY Social History   Tobacco Use  . Smoking status: Former Smoker    Packs/day: 1.50    Years: 40.00    Pack years: 60.00    Quit date: 09/19/1968    Years since quitting: 51.8  . Smokeless tobacco: Never Used  Substance Use Topics  . Alcohol use: No  . Drug use: No         OPHTHALMIC EXAM:  Base Eye Exam    Visual Acuity (ETDRS)      Right Left   Dist [place] CF @ 5' 20/200 -1   Dist ph [place] NI NI       Tonometry (Tonopen, 8:06 AM)      Right Left   Pressure 14 17       Pupils      Pupils Dark Light Shape React APD   Right PERRL 3 2 Round Slow None   Left PERRL 3 2 Round Slow None       Visual Fields (Counting fingers)      Left Right    Full Full       Extraocular Movement  Right Left    Full Full       Neuro/Psych    Oriented x3: Yes   Mood/Affect: Normal       Dilation    Both eyes: 1.0% Mydriacyl, 2.5% Phenylephrine @ 8:08 AM        Slit Lamp and Fundus Exam    External Exam      Right Left   External Normal Normal       Slit Lamp Exam      Right Left   Lids/Lashes Normal Normal   Conjunctiva/Sclera White and quiet White and quiet   Cornea Clear Clear   Anterior Chamber Deep and quiet Deep and quiet   Iris Round and reactive Round and reactive   Lens Posterior chamber intraocular lens Posterior chamber intraocular lens   Anterior Vitreous Normal Normal       Fundus Exam      Right Left   Posterior Vitreous Posterior vitreous detachment Posterior vitreous detachment   Disc Normal Normal   C/D Ratio 0.25 0.2   Macula Disciform scar, Macular thickening, no hemorrhage, Subretinal fibrosis,, 12-15 disc areas in size centrally no active edges Drusen, Disciform scar, large with pigmentary ring.  No active subretinal hemorrhage or fluid peripherally.   Vessels Normal Normal    Periphery Normal Normal          IMAGING AND PROCEDURES  Imaging and Procedures for 07/07/20  OCT, Retina - OU - Both Eyes       Right Eye Quality was good. Scan locations included subfoveal. Central Foveal Thickness: 672. Progression has been stable. Findings include abnormal foveal contour, disciform scar, no IRF, inner retinal atrophy, central retinal atrophy, subretinal scarring, outer retinal atrophy.   Left Eye Quality was good. Scan locations included subfoveal. Central Foveal Thickness: 318. Progression has improved. Findings include abnormal foveal contour, no SRF, disciform scar, outer retinal atrophy, subretinal scarring.   Notes OS with fewer active edges to the lesion.  Now 8 to 9 weeks post Avastin.  We will repeat injection today And examination in 3 to 4 months       Intravitreal Injection, Pharmacologic Agent - OS - Left Eye       Time Out 07/07/2020. 8:49 AM. Confirmed correct patient, procedure, site, and patient consented.   Anesthesia Topical anesthesia was used. Anesthetic medications included Akten 3.5%.   Procedure Preparation included Ofloxacin , Tobramycin 0.3%, 10% betadine to eyelids, 5% betadine to ocular surface. A 30 gauge needle was used.   Injection:  1.25 mg Bevacizumab (AVASTIN) SOLN   NDC: 70360-001-02, Lot: [id number]   Route: Intravitreal, Site: Left Eye, Waste: 0 mg  Post-op Post injection exam found visual acuity of at least counting fingers. The patient tolerated the procedure well. There were no complications. The patient received written and verbal post procedure care education. Post injection medications were not given.                 ASSESSMENT/PLAN:  Exudative age-related macular degeneration of left eye with active choroidal neovascularization (HCC) OS with fewer active edges to the lesion.  Now 8 to 9 weeks post Avastin.  We will repeat injection today And examination in 3 to 4 months      ICD-10-CM   1.  Exudative age-related macular degeneration of left eye with active choroidal neovascularization (HCC)  H35.3221 OCT, Retina - OU - Both Eyes    Intravitreal Injection, Pharmacologic Agent - OS - Left Eye    Bevacizumab (AVASTIN) SOLN  1.25 mg  2. Exudative age-related macular degeneration of right eye with inactive choroidal neovascularization ([place])  H35.3212     1.  Repeat injection intravitreal Avastin OS today  2.  Follow-up in 4 months to monitor scotoma growth or changes.  3.  Ophthalmic Meds Ordered this visit:  Meds ordered this encounter  Medications  . Bevacizumab (AVASTIN) SOLN 1.25 mg       Return in about 4 months (around 11/07/2020) for DILATE OU, AVASTIN OCT, OS.  Patient Instructions  Patient instructed to promptly report any new onset visual acuity declines or distortions    Explained the diagnoses, plan, and follow up with the patient and they expressed understanding.  Patient expressed understanding of the importance of proper follow up care.   Clent Demark Adyline Huberty M.D. Diseases & Surgery of the Retina and Vitreous Retina & Diabetic [place] 07/07/20     Abbreviations: M myopia (nearsighted); A astigmatism; H hyperopia (farsighted); P presbyopia; Mrx spectacle prescription;  CTL contact lenses; OD right eye; OS left eye; OU both eyes  XT exotropia; ET esotropia; PEK punctate epithelial keratitis; PEE punctate epithelial erosions; DES dry eye syndrome; MGD meibomian gland dysfunction; ATs artificial tears; PFAT's preservative free artificial tears; [place] nuclear sclerotic cataract; PSC posterior subcapsular cataract; ERM epi-retinal membrane; PVD posterior vitreous detachment; RD retinal detachment; DM diabetes mellitus; DR diabetic retinopathy; NPDR non-proliferative diabetic retinopathy; PDR proliferative diabetic retinopathy; CSME clinically significant macular edema; DME diabetic macular edema; dbh dot blot hemorrhages; CWS cotton wool spot; POAG primary open angle  glaucoma; C/D cup-to-disc ratio; HVF humphrey visual field; GVF goldmann visual field; OCT optical coherence tomography; IOP intraocular pressure; BRVO Branch retinal vein occlusion; CRVO central retinal vein occlusion; CRAO central retinal artery occlusion; BRAO branch retinal artery occlusion; RT retinal tear; SB scleral buckle; PPV pars plana vitrectomy; VH Vitreous hemorrhage; PRP panretinal laser photocoagulation; IVK intravitreal kenalog; VMT vitreomacular traction; MH Macular hole;  NVD neovascularization of the disc; NVE neovascularization elsewhere; AREDS age related eye disease study; ARMD age related macular degeneration; POAG primary open angle glaucoma; EBMD epithelial/anterior basement membrane dystrophy; ACIOL anterior chamber intraocular lens; IOL intraocular lens; PCIOL posterior chamber intraocular lens; Phaco/IOL phacoemulsification with intraocular lens placement; [place] photorefractive keratectomy; LASIK laser assisted in situ keratomileusis; HTN hypertension; DM diabetes mellitus; COPD chronic obstructive pulmonary disease

## 2020-07-07 NOTE — Assessment & Plan Note (Signed)
OS with fewer active edges to the lesion.  Now 8 to 9 weeks post Avastin.  We will repeat injection today And examination in 3 to 4 months

## 2020-07-13 DIAGNOSIS — G4733 Obstructive sleep apnea (adult) (pediatric): Secondary | ICD-10-CM | POA: Diagnosis not present

## 2020-07-17 DIAGNOSIS — E1122 Type 2 diabetes mellitus with diabetic chronic kidney disease: Secondary | ICD-10-CM | POA: Diagnosis not present

## 2020-07-17 DIAGNOSIS — E1142 Type 2 diabetes mellitus with diabetic polyneuropathy: Secondary | ICD-10-CM | POA: Diagnosis not present

## 2020-07-17 DIAGNOSIS — I10 Essential (primary) hypertension: Secondary | ICD-10-CM | POA: Diagnosis not present

## 2020-07-17 DIAGNOSIS — I2510 Atherosclerotic heart disease of native coronary artery without angina pectoris: Secondary | ICD-10-CM | POA: Diagnosis not present

## 2020-07-17 DIAGNOSIS — N183 Chronic kidney disease, stage 3 unspecified: Secondary | ICD-10-CM | POA: Diagnosis not present

## 2020-07-17 DIAGNOSIS — E7800 Pure hypercholesterolemia, unspecified: Secondary | ICD-10-CM | POA: Diagnosis not present

## 2020-07-17 DIAGNOSIS — F4321 Adjustment disorder with depressed mood: Secondary | ICD-10-CM | POA: Diagnosis not present

## 2020-07-17 DIAGNOSIS — N1832 Chronic kidney disease, stage 3b: Secondary | ICD-10-CM | POA: Diagnosis not present

## 2020-07-17 DIAGNOSIS — I129 Hypertensive chronic kidney disease with stage 1 through stage 4 chronic kidney disease, or unspecified chronic kidney disease: Secondary | ICD-10-CM | POA: Diagnosis not present

## 2020-07-27 DIAGNOSIS — E1122 Type 2 diabetes mellitus with diabetic chronic kidney disease: Secondary | ICD-10-CM | POA: Diagnosis not present

## 2020-08-03 DIAGNOSIS — G4733 Obstructive sleep apnea (adult) (pediatric): Secondary | ICD-10-CM | POA: Diagnosis not present

## 2020-08-13 DIAGNOSIS — G4733 Obstructive sleep apnea (adult) (pediatric): Secondary | ICD-10-CM | POA: Diagnosis not present

## 2020-08-17 DIAGNOSIS — I10 Essential (primary) hypertension: Secondary | ICD-10-CM | POA: Diagnosis not present

## 2020-08-18 DIAGNOSIS — I10 Essential (primary) hypertension: Secondary | ICD-10-CM | POA: Diagnosis not present

## 2020-09-02 DIAGNOSIS — G4733 Obstructive sleep apnea (adult) (pediatric): Secondary | ICD-10-CM | POA: Diagnosis not present

## 2020-09-12 DIAGNOSIS — G4733 Obstructive sleep apnea (adult) (pediatric): Secondary | ICD-10-CM | POA: Diagnosis not present

## 2020-09-16 DIAGNOSIS — E1142 Type 2 diabetes mellitus with diabetic polyneuropathy: Secondary | ICD-10-CM | POA: Diagnosis not present

## 2020-09-16 DIAGNOSIS — I129 Hypertensive chronic kidney disease with stage 1 through stage 4 chronic kidney disease, or unspecified chronic kidney disease: Secondary | ICD-10-CM | POA: Diagnosis not present

## 2020-09-16 DIAGNOSIS — I2510 Atherosclerotic heart disease of native coronary artery without angina pectoris: Secondary | ICD-10-CM | POA: Diagnosis not present

## 2020-09-16 DIAGNOSIS — F4321 Adjustment disorder with depressed mood: Secondary | ICD-10-CM | POA: Diagnosis not present

## 2020-09-16 DIAGNOSIS — E1122 Type 2 diabetes mellitus with diabetic chronic kidney disease: Secondary | ICD-10-CM | POA: Diagnosis not present

## 2020-09-16 DIAGNOSIS — E7800 Pure hypercholesterolemia, unspecified: Secondary | ICD-10-CM | POA: Diagnosis not present

## 2020-09-16 DIAGNOSIS — G8929 Other chronic pain: Secondary | ICD-10-CM | POA: Diagnosis not present

## 2020-09-16 DIAGNOSIS — N183 Chronic kidney disease, stage 3 unspecified: Secondary | ICD-10-CM | POA: Diagnosis not present

## 2020-09-16 DIAGNOSIS — N1832 Chronic kidney disease, stage 3b: Secondary | ICD-10-CM | POA: Diagnosis not present

## 2020-09-16 DIAGNOSIS — I10 Essential (primary) hypertension: Secondary | ICD-10-CM | POA: Diagnosis not present

## 2020-09-17 DIAGNOSIS — I10 Essential (primary) hypertension: Secondary | ICD-10-CM | POA: Diagnosis not present

## 2020-10-13 DIAGNOSIS — G4733 Obstructive sleep apnea (adult) (pediatric): Secondary | ICD-10-CM | POA: Diagnosis not present

## 2020-10-19 DIAGNOSIS — I129 Hypertensive chronic kidney disease with stage 1 through stage 4 chronic kidney disease, or unspecified chronic kidney disease: Secondary | ICD-10-CM | POA: Diagnosis not present

## 2020-11-09 ENCOUNTER — Other Ambulatory Visit: Payer: Self-pay

## 2020-11-09 ENCOUNTER — Encounter (INDEPENDENT_AMBULATORY_CARE_PROVIDER_SITE_OTHER): Payer: PPO | Admitting: Ophthalmology

## 2020-11-09 ENCOUNTER — Ambulatory Visit (INDEPENDENT_AMBULATORY_CARE_PROVIDER_SITE_OTHER): Payer: PPO | Admitting: Ophthalmology

## 2020-11-09 ENCOUNTER — Encounter (INDEPENDENT_AMBULATORY_CARE_PROVIDER_SITE_OTHER): Payer: Self-pay | Admitting: Ophthalmology

## 2020-11-09 DIAGNOSIS — H353221 Exudative age-related macular degeneration, left eye, with active choroidal neovascularization: Secondary | ICD-10-CM

## 2020-11-09 DIAGNOSIS — H353213 Exudative age-related macular degeneration, right eye, with inactive scar: Secondary | ICD-10-CM | POA: Diagnosis not present

## 2020-11-09 DIAGNOSIS — H353134 Nonexudative age-related macular degeneration, bilateral, advanced atrophic with subfoveal involvement: Secondary | ICD-10-CM

## 2020-11-09 MED ORDER — BEVACIZUMAB 2.5 MG/0.1ML IZ SOSY
2.5000 mg | PREFILLED_SYRINGE | INTRAVITREAL | Status: AC | PRN
  Administered 2020-11-09: 2.5 mg via INTRAVITREAL

## 2020-11-09 NOTE — Assessment & Plan Note (Signed)
Accounts for acuity OU ?

## 2020-11-09 NOTE — Assessment & Plan Note (Signed)
The nature of wet macular degeneration was discussed with the patient.  Forms of therapy reviewed include the use of Anti-VEGF medications injected painlessly into the eye, as well as other possible treatment modalities, including thermal laser therapy. Fellow eye involvement and risks were discussed with the patient. Upon the finding of wet age related macular degeneration, treatment will be offered. The treatment regimen is on a treat as needed basis with the intent to treat if necessary and extend interval of exams when possible. On average 1 out of 6 patients do not need lifetime therapy. However, the risk of recurrent disease is high for a lifetime.  Initially monthly, then periodic, examinations and evaluations will determine whether the next treatment is required on the day of the examination.  Subfoveal large white disciform scar left eye, with less active edges on therapy, for stability, currently 35-month follow-up, will repeat traction today and examination in 5 months

## 2020-11-09 NOTE — Assessment & Plan Note (Signed)
Massive but not active OD

## 2020-11-09 NOTE — Progress Notes (Signed)
11/09/2020     CHIEF COMPLAINT Patient presents for Retina Follow Up (4 Month F/U OU, poss Avastin OS//Pt reports decreased VA OS since last visit. VA OD stable per pt.)   HISTORY OF PRESENT ILLNESS: Jimmy Butler is a 87 y.o. male who presents to the clinic today for:   HPI    Retina Follow Up    Patient presents with  Wet AMD.  In left eye.  This started 4 months ago.  Severity is moderate.  Duration of 4 months.  Since onset it is gradually worsening. Additional comments: 4 Month F/U OU, poss Avastin OS  Pt reports decreased VA OS since last visit. VA OD stable per pt.       Last edited by Rockie Neighbours, [place] on 11/09/2020  9:27 AM. (History)      Referring physician: Josetta Huddle, MD [address],  [place] [address]  HISTORICAL INFORMATION:   Selected notes from the [facility]: No current outpatient medications on file. (Ophthalmic Drugs)   No current facility-administered medications for this visit. (Ophthalmic Drugs)   Current Outpatient Medications (Other)  Medication Sig  . aspirin 325 MG tablet Take 325 mg by mouth daily.    . clopidogrel (PLAVIX) 75 MG tablet Take 75 mg by mouth daily.    Marland Kitchen lisinopril-hydrochlorothiazide (PRINZIDE,ZESTORETIC) 20-12.5 MG per tablet Take 1 tablet by mouth daily.    . metoprolol tartrate (LOPRESSOR) 25 MG tablet Take 25 mg by mouth 2 (two) times daily.  . Misc Natural Products (GLUCOSAMINE CHONDROITIN ADV PO) Take 1 tablet by mouth 2 (two) times daily.   . Multiple Vitamins-Minerals (EYE VITAMINS PO) Take 1 tablet by mouth daily.   . rosuvastatin (CRESTOR) 10 MG tablet Take 5 mg by mouth daily.  . vitamin B-12 (CYANOCOBALAMIN) 1000 MCG tablet Take 1,000 mcg by mouth daily.     No current facility-administered medications for this visit. (Other)      REVIEW OF SYSTEMS:    ALLERGIES No Known Allergies  PAST MEDICAL HISTORY Past Medical History:  Diagnosis  Date  . Diabetes mellitus without complication ([place])    History reviewed. No pertinent surgical history.  FAMILY HISTORY Family History  Problem Relation Age of Onset  . Heart disease Father     SOCIAL HISTORY Social History   Tobacco Use  . Smoking status: Former Smoker    Packs/day: 1.50    Years: 40.00    Pack years: 60.00    Quit date: 09/19/1968    Years since quitting: 52.1  . Smokeless tobacco: Never Used  Substance Use Topics  . Alcohol use: No  . Drug use: No         OPHTHALMIC EXAM:  Base Eye Exam    Visual Acuity (ETDRS)      Right Left   Dist [place] CF @ 4' 20/400 barely   Dist ph [place] NI NI   Correction: Glasses       Tonometry (Tonopen, 9:28 AM)      Right Left   Pressure 13 18       Pupils      Pupils Dark Light Shape React APD   Right PERRL 5 5 Round Minimal None   Left PERRL 5 5 Round Minimal None       Visual Fields (Counting fingers)      Left Right    Full Full  Extraocular Movement      Right Left    Full Full       Neuro/Psych    Oriented x3: Yes   Mood/Affect: Normal       Dilation    Both eyes: 1.0% Mydriacyl, 2.5% Phenylephrine @ 9:30 AM        Slit Lamp and Fundus Exam    External Exam      Right Left   External Normal Normal       Slit Lamp Exam      Right Left   Lids/Lashes Normal Normal   Conjunctiva/Sclera White and quiet White and quiet   Cornea Clear Clear   Anterior Chamber Deep and quiet Deep and quiet   Iris Round and reactive Round and reactive   Lens Posterior chamber intraocular lens Posterior chamber intraocular lens   Anterior Vitreous Normal Normal       Fundus Exam      Right Left   Posterior Vitreous Posterior vitreous detachment Posterior vitreous detachment   Disc Normal Normal   C/D Ratio 0.25 0.2   Macula Disciform scar 22 da size, Macular thickening, no hemorrhage, Subretinal fibrosis,, 12-15 disc areas in size centrally no active edges Drusen, Disciform scar large , large with  pigmentary ring.  No active subretinal hemorrhage or fluid peripherally.   Vessels Normal Normal   Periphery Normal Normal          IMAGING AND PROCEDURES  Imaging and Procedures for 11/09/20  OCT, Retina - OU - Both Eyes       Right Eye Quality was good. Scan locations included subfoveal. Central Foveal Thickness: 487. Progression has been stable. Findings include abnormal foveal contour, disciform scar, no IRF, inner retinal atrophy, central retinal atrophy, subretinal scarring, outer retinal atrophy.   Left Eye Quality was good. Scan locations included subfoveal. Central Foveal Thickness: 276. Progression has improved. Findings include abnormal foveal contour, no SRF, disciform scar, outer retinal atrophy, subretinal scarring.   Notes OS with fewer active edges to the lesion.  Now 4 months post Avastin.  We will repeat injection today And examination in 5 months       Intravitreal Injection, Pharmacologic Agent - OS - Left Eye       Time Out 11/09/2020. 10:15 AM. Confirmed correct patient, procedure, site, and patient consented.   Anesthesia Topical anesthesia was used. Anesthetic medications included Akten 3.5%.   Procedure Preparation included Ofloxacin , Tobramycin 0.3%, 10% betadine to eyelids, 5% betadine to ocular surface. A 30 gauge needle was used.   Injection:  2.5 mg Bevacizumab (AVASTIN) 2.5mg /0.71mL SOSY   NDC: 38101-751-02, Lot: [id number]   Route: Intravitreal, Site: Left Eye  Post-op Post injection exam found visual acuity of at least counting fingers. The patient tolerated the procedure well. There were no complications. The patient received written and verbal post procedure care education. Post injection medications were not given.                 ASSESSMENT/PLAN:  Exudative age-related macular degeneration of left eye with active choroidal neovascularization (HCC) The nature of wet macular degeneration was discussed with the patient.  Forms of  therapy reviewed include the use of Anti-VEGF medications injected painlessly into the eye, as well as other possible treatment modalities, including thermal laser therapy. Fellow eye involvement and risks were discussed with the patient. Upon the finding of wet age related macular degeneration, treatment will be offered. The treatment regimen is on a treat as needed basis  with the intent to treat if necessary and extend interval of exams when possible. On average 1 out of 6 patients do not need lifetime therapy. However, the risk of recurrent disease is high for a lifetime.  Initially monthly, then periodic, examinations and evaluations will determine whether the next treatment is required on the day of the examination.  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 Exudative age-related macular degeneration of left eye with active choroidal neovascularization (HCC)  H35.3221 OCT, Retina - OU - Both Eyes    Intravitreal Injection, Pharmacologic Agent - OS - Left Eye    bevacizumab (AVASTIN) SOSY 2.5 mg  2. 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 Exudative age-related macular degeneration of right eye with inactive scar ([place])  H35.3213     1.  Repeat injection Avastin OS today at 30-month interval to maintain an examination repeat in 5 months OU  2.  3.  Ophthalmic Meds Ordered this visit:  Meds ordered this encounter  Medications  . bevacizumab (AVASTIN) SOSY 2.5 mg       Return in about 5 months (around 04/08/2021) for DILATE OU, AVASTIN  OCT, OS.  There are no Patient Instructions on file for this visit.   Explained the diagnoses, plan, and follow up with the patient and they expressed understanding.  Patient expressed understanding of the importance of proper follow up care.   Clent Demark Yuriel Lopezmartinez M.D. Diseases & Surgery of the Retina and Vitreous Retina & Diabetic [place] 11/09/20     Abbreviations: M myopia (nearsighted); A astigmatism; H hyperopia (farsighted); P presbyopia; Mrx spectacle prescription;  CTL contact lenses; OD right eye; OS left eye; OU both eyes  XT exotropia; ET esotropia; PEK punctate epithelial keratitis; PEE punctate epithelial erosions; DES dry eye syndrome; MGD meibomian gland dysfunction; ATs artificial tears; PFAT's preservative free artificial tears; [place] nuclear sclerotic cataract; PSC posterior subcapsular cataract; ERM epi-retinal membrane; PVD posterior vitreous detachment; RD retinal detachment; DM diabetes mellitus; DR diabetic retinopathy; NPDR non-proliferative diabetic retinopathy; PDR proliferative diabetic retinopathy; CSME clinically significant macular edema; DME diabetic macular edema; dbh dot blot hemorrhages; CWS cotton wool spot; POAG primary open angle glaucoma; C/D cup-to-disc ratio; HVF humphrey visual field; GVF goldmann visual field; OCT optical coherence tomography; IOP intraocular pressure; BRVO Branch retinal vein occlusion; CRVO central retinal vein occlusion; CRAO central retinal artery occlusion; BRAO branch retinal artery occlusion; RT retinal tear; SB scleral buckle; PPV pars plana vitrectomy; VH Vitreous hemorrhage; PRP panretinal laser photocoagulation; IVK intravitreal kenalog; VMT vitreomacular traction; MH Macular hole;  NVD neovascularization of the disc; NVE neovascularization elsewhere; AREDS age related eye disease study; ARMD age related macular degeneration; POAG primary open angle glaucoma; EBMD epithelial/anterior basement membrane dystrophy; ACIOL anterior chamber  intraocular lens; IOL intraocular lens; PCIOL posterior chamber intraocular lens; Phaco/IOL phacoemulsification with intraocular lens placement; [place] photorefractive keratectomy; LASIK laser assisted in situ keratomileusis; HTN hypertension; DM diabetes mellitus; COPD chronic obstructive pulmonary disease

## 2020-11-11 DIAGNOSIS — I129 Hypertensive chronic kidney disease with stage 1 through stage 4 chronic kidney disease, or unspecified chronic kidney disease: Secondary | ICD-10-CM | POA: Diagnosis not present

## 2020-11-11 DIAGNOSIS — I10 Essential (primary) hypertension: Secondary | ICD-10-CM | POA: Diagnosis not present

## 2020-11-11 DIAGNOSIS — I2510 Atherosclerotic heart disease of native coronary artery without angina pectoris: Secondary | ICD-10-CM | POA: Diagnosis not present

## 2020-11-11 DIAGNOSIS — E1122 Type 2 diabetes mellitus with diabetic chronic kidney disease: Secondary | ICD-10-CM | POA: Diagnosis not present

## 2020-11-11 DIAGNOSIS — F4321 Adjustment disorder with depressed mood: Secondary | ICD-10-CM | POA: Diagnosis not present

## 2020-11-11 DIAGNOSIS — N1832 Chronic kidney disease, stage 3b: Secondary | ICD-10-CM | POA: Diagnosis not present

## 2020-11-11 DIAGNOSIS — E7800 Pure hypercholesterolemia, unspecified: Secondary | ICD-10-CM | POA: Diagnosis not present

## 2020-11-11 DIAGNOSIS — G8929 Other chronic pain: Secondary | ICD-10-CM | POA: Diagnosis not present

## 2020-11-11 DIAGNOSIS — E1142 Type 2 diabetes mellitus with diabetic polyneuropathy: Secondary | ICD-10-CM | POA: Diagnosis not present

## 2020-11-11 DIAGNOSIS — N183 Chronic kidney disease, stage 3 unspecified: Secondary | ICD-10-CM | POA: Diagnosis not present

## 2020-11-12 DIAGNOSIS — G4733 Obstructive sleep apnea (adult) (pediatric): Secondary | ICD-10-CM | POA: Diagnosis not present

## 2020-11-13 DIAGNOSIS — G4733 Obstructive sleep apnea (adult) (pediatric): Secondary | ICD-10-CM | POA: Diagnosis not present

## 2020-11-16 DIAGNOSIS — I10 Essential (primary) hypertension: Secondary | ICD-10-CM | POA: Diagnosis not present

## 2020-11-29 DIAGNOSIS — G8929 Other chronic pain: Secondary | ICD-10-CM | POA: Diagnosis not present

## 2020-11-29 DIAGNOSIS — I129 Hypertensive chronic kidney disease with stage 1 through stage 4 chronic kidney disease, or unspecified chronic kidney disease: Secondary | ICD-10-CM | POA: Diagnosis not present

## 2020-11-29 DIAGNOSIS — E1122 Type 2 diabetes mellitus with diabetic chronic kidney disease: Secondary | ICD-10-CM | POA: Diagnosis not present

## 2020-11-29 DIAGNOSIS — F4321 Adjustment disorder with depressed mood: Secondary | ICD-10-CM | POA: Diagnosis not present

## 2020-11-29 DIAGNOSIS — E1142 Type 2 diabetes mellitus with diabetic polyneuropathy: Secondary | ICD-10-CM | POA: Diagnosis not present

## 2020-11-29 DIAGNOSIS — E7800 Pure hypercholesterolemia, unspecified: Secondary | ICD-10-CM | POA: Diagnosis not present

## 2020-11-29 DIAGNOSIS — I10 Essential (primary) hypertension: Secondary | ICD-10-CM | POA: Diagnosis not present

## 2020-11-29 DIAGNOSIS — I2510 Atherosclerotic heart disease of native coronary artery without angina pectoris: Secondary | ICD-10-CM | POA: Diagnosis not present

## 2020-12-10 DIAGNOSIS — Z6841 Body Mass Index (BMI) 40.0 and over, adult: Secondary | ICD-10-CM | POA: Diagnosis not present

## 2020-12-10 DIAGNOSIS — E1122 Type 2 diabetes mellitus with diabetic chronic kidney disease: Secondary | ICD-10-CM | POA: Diagnosis not present

## 2020-12-10 DIAGNOSIS — N1832 Chronic kidney disease, stage 3b: Secondary | ICD-10-CM | POA: Diagnosis not present

## 2020-12-10 DIAGNOSIS — Z0000 Encounter for general adult medical examination without abnormal findings: Secondary | ICD-10-CM | POA: Diagnosis not present

## 2020-12-10 DIAGNOSIS — Z1389 Encounter for screening for other disorder: Secondary | ICD-10-CM | POA: Diagnosis not present

## 2020-12-10 DIAGNOSIS — E7800 Pure hypercholesterolemia, unspecified: Secondary | ICD-10-CM | POA: Diagnosis not present

## 2020-12-10 DIAGNOSIS — I2510 Atherosclerotic heart disease of native coronary artery without angina pectoris: Secondary | ICD-10-CM | POA: Diagnosis not present

## 2020-12-10 DIAGNOSIS — I129 Hypertensive chronic kidney disease with stage 1 through stage 4 chronic kidney disease, or unspecified chronic kidney disease: Secondary | ICD-10-CM | POA: Diagnosis not present

## 2020-12-10 DIAGNOSIS — E1142 Type 2 diabetes mellitus with diabetic polyneuropathy: Secondary | ICD-10-CM | POA: Diagnosis not present

## 2020-12-11 DIAGNOSIS — G4733 Obstructive sleep apnea (adult) (pediatric): Secondary | ICD-10-CM | POA: Diagnosis not present

## 2020-12-17 DIAGNOSIS — I129 Hypertensive chronic kidney disease with stage 1 through stage 4 chronic kidney disease, or unspecified chronic kidney disease: Secondary | ICD-10-CM | POA: Diagnosis not present

## 2021-01-11 DIAGNOSIS — G4733 Obstructive sleep apnea (adult) (pediatric): Secondary | ICD-10-CM | POA: Diagnosis not present

## 2021-02-10 DIAGNOSIS — G4733 Obstructive sleep apnea (adult) (pediatric): Secondary | ICD-10-CM | POA: Diagnosis not present

## 2021-02-11 DIAGNOSIS — G4733 Obstructive sleep apnea (adult) (pediatric): Secondary | ICD-10-CM | POA: Diagnosis not present

## 2021-02-12 DIAGNOSIS — G8929 Other chronic pain: Secondary | ICD-10-CM | POA: Diagnosis not present

## 2021-02-12 DIAGNOSIS — E1122 Type 2 diabetes mellitus with diabetic chronic kidney disease: Secondary | ICD-10-CM | POA: Diagnosis not present

## 2021-02-12 DIAGNOSIS — F4321 Adjustment disorder with depressed mood: Secondary | ICD-10-CM | POA: Diagnosis not present

## 2021-02-12 DIAGNOSIS — I129 Hypertensive chronic kidney disease with stage 1 through stage 4 chronic kidney disease, or unspecified chronic kidney disease: Secondary | ICD-10-CM | POA: Diagnosis not present

## 2021-02-12 DIAGNOSIS — E7800 Pure hypercholesterolemia, unspecified: Secondary | ICD-10-CM | POA: Diagnosis not present

## 2021-02-12 DIAGNOSIS — I2510 Atherosclerotic heart disease of native coronary artery without angina pectoris: Secondary | ICD-10-CM | POA: Diagnosis not present

## 2021-02-12 DIAGNOSIS — E1142 Type 2 diabetes mellitus with diabetic polyneuropathy: Secondary | ICD-10-CM | POA: Diagnosis not present

## 2021-02-12 DIAGNOSIS — I10 Essential (primary) hypertension: Secondary | ICD-10-CM | POA: Diagnosis not present

## 2021-02-16 DIAGNOSIS — I129 Hypertensive chronic kidney disease with stage 1 through stage 4 chronic kidney disease, or unspecified chronic kidney disease: Secondary | ICD-10-CM | POA: Diagnosis not present

## 2021-02-27 DIAGNOSIS — J1282 Pneumonia due to coronavirus disease 2019: Secondary | ICD-10-CM | POA: Diagnosis not present

## 2021-02-27 DIAGNOSIS — R768 Other specified abnormal immunological findings in serum: Secondary | ICD-10-CM | POA: Diagnosis not present

## 2021-02-27 DIAGNOSIS — U071 COVID-19: Secondary | ICD-10-CM | POA: Diagnosis not present

## 2021-02-28 ENCOUNTER — Other Ambulatory Visit: Payer: Self-pay

## 2021-02-28 ENCOUNTER — Emergency Department: Payer: PPO

## 2021-02-28 ENCOUNTER — Inpatient Hospital Stay
Admission: EM | Admit: 2021-02-28 | Discharge: 2021-03-05 | DRG: 178 | Disposition: A | Discharge status: Home Health | Payer: PPO | Attending: Internal Medicine | Admitting: Internal Medicine

## 2021-02-28 DIAGNOSIS — U071 COVID-19: Principal | ICD-10-CM | POA: Diagnosis present

## 2021-02-28 DIAGNOSIS — I2510 Atherosclerotic heart disease of native coronary artery without angina pectoris: Secondary | ICD-10-CM | POA: Diagnosis present

## 2021-02-28 DIAGNOSIS — E782 Mixed hyperlipidemia: Secondary | ICD-10-CM | POA: Diagnosis present

## 2021-02-28 DIAGNOSIS — N179 Acute kidney failure, unspecified: Secondary | ICD-10-CM | POA: Diagnosis present

## 2021-02-28 DIAGNOSIS — E875 Hyperkalemia: Secondary | ICD-10-CM | POA: Diagnosis present

## 2021-02-28 DIAGNOSIS — Z8249 Family history of ischemic heart disease and other diseases of the circulatory system: Secondary | ICD-10-CM | POA: Diagnosis not present

## 2021-02-28 DIAGNOSIS — E1165 Type 2 diabetes mellitus with hyperglycemia: Secondary | ICD-10-CM | POA: Diagnosis present

## 2021-02-28 DIAGNOSIS — E1122 Type 2 diabetes mellitus with diabetic chronic kidney disease: Secondary | ICD-10-CM | POA: Diagnosis present

## 2021-02-28 DIAGNOSIS — R0602 Shortness of breath: Secondary | ICD-10-CM | POA: Diagnosis not present

## 2021-02-28 DIAGNOSIS — E871 Hypo-osmolality and hyponatremia: Secondary | ICD-10-CM | POA: Diagnosis present

## 2021-02-28 DIAGNOSIS — N1832 Chronic kidney disease, stage 3b: Secondary | ICD-10-CM | POA: Diagnosis present

## 2021-02-28 DIAGNOSIS — K5900 Constipation, unspecified: Secondary | ICD-10-CM | POA: Diagnosis present

## 2021-02-28 DIAGNOSIS — Z79899 Other long term (current) drug therapy: Secondary | ICD-10-CM | POA: Diagnosis not present

## 2021-02-28 DIAGNOSIS — N189 Chronic kidney disease, unspecified: Secondary | ICD-10-CM | POA: Diagnosis not present

## 2021-02-28 DIAGNOSIS — Z87891 Personal history of nicotine dependence: Secondary | ICD-10-CM | POA: Diagnosis not present

## 2021-02-28 DIAGNOSIS — Z6841 Body Mass Index (BMI) 40.0 and over, adult: Secondary | ICD-10-CM

## 2021-02-28 DIAGNOSIS — J441 Chronic obstructive pulmonary disease with (acute) exacerbation: Secondary | ICD-10-CM | POA: Diagnosis present

## 2021-02-28 DIAGNOSIS — K219 Gastro-esophageal reflux disease without esophagitis: Secondary | ICD-10-CM | POA: Diagnosis present

## 2021-02-28 DIAGNOSIS — Z28310 Unvaccinated for covid-19: Secondary | ICD-10-CM | POA: Diagnosis not present

## 2021-02-28 DIAGNOSIS — Z7982 Long term (current) use of aspirin: Secondary | ICD-10-CM | POA: Diagnosis not present

## 2021-02-28 DIAGNOSIS — N183 Chronic kidney disease, stage 3 unspecified: Secondary | ICD-10-CM | POA: Diagnosis present

## 2021-02-28 DIAGNOSIS — Z7902 Long term (current) use of antithrombotics/antiplatelets: Secondary | ICD-10-CM

## 2021-02-28 DIAGNOSIS — E860 Dehydration: Secondary | ICD-10-CM | POA: Diagnosis present

## 2021-02-28 DIAGNOSIS — R531 Weakness: Secondary | ICD-10-CM

## 2021-02-28 DIAGNOSIS — I10 Essential (primary) hypertension: Secondary | ICD-10-CM | POA: Diagnosis present

## 2021-02-28 DIAGNOSIS — I129 Hypertensive chronic kidney disease with stage 1 through stage 4 chronic kidney disease, or unspecified chronic kidney disease: Secondary | ICD-10-CM | POA: Diagnosis present

## 2021-02-28 DIAGNOSIS — E119 Type 2 diabetes mellitus without complications: Secondary | ICD-10-CM

## 2021-02-28 LAB — BASIC METABOLIC PANEL
Anion gap: 7 (ref 5–15)
BUN: 65 mg/dL — ABNORMAL HIGH (ref 8–23)
CO2: 21 mmol/L — ABNORMAL LOW (ref 22–32)
Calcium: 8.5 mg/dL — ABNORMAL LOW (ref 8.9–10.3)
Chloride: 102 mmol/L (ref 98–111)
Creatinine, Ser: 1.97 mg/dL — ABNORMAL HIGH (ref 0.61–1.24)
GFR, Estimated: 32 mL/min — ABNORMAL LOW (ref >60)
Glucose, Bld: 143 mg/dL — ABNORMAL HIGH (ref 70–99)
Potassium: 4.8 mmol/L (ref 3.5–5.1)
Sodium: 130 mmol/L — ABNORMAL LOW (ref 135–145)

## 2021-02-28 LAB — CBC
HCT: 33.4 % — ABNORMAL LOW (ref 39.0–52.0)
Hemoglobin: 11.4 g/dL — ABNORMAL LOW (ref 13.0–17.0)
MCH: 31.1 pg (ref 26.0–34.0)
MCHC: 34.1 g/dL (ref 30.0–36.0)
MCV: 91 fL (ref 80.0–100.0)
Platelets: 143 10*3/uL — ABNORMAL LOW (ref 150–400)
RBC: 3.67 MIL/uL — ABNORMAL LOW (ref 4.22–5.81)
RDW: 13.2 % (ref 11.5–15.5)
WBC: 7.3 10*3/uL (ref 4.0–10.5)
nRBC: 0 % (ref 0.0–0.2)

## 2021-02-28 LAB — BRAIN NATRIURETIC PEPTIDE: B Natriuretic Peptide: 134.3 pg/mL — ABNORMAL HIGH (ref 0.0–100.0)

## 2021-02-28 LAB — PROTIME-INR
INR: 1 (ref 0.8–1.2)
Prothrombin Time: 13 seconds (ref 11.4–15.2)

## 2021-02-28 LAB — TROPONIN I (HIGH SENSITIVITY): Troponin I (High Sensitivity): 40 ng/L — ABNORMAL HIGH (ref <18)

## 2021-02-28 MED ORDER — IPRATROPIUM-ALBUTEROL 0.5-2.5 (3) MG/3ML IN SOLN
3.0000 mL | Freq: Once | RESPIRATORY_TRACT | Status: AC
  Administered 2021-02-28: 3 mL via RESPIRATORY_TRACT

## 2021-02-28 MED ORDER — IPRATROPIUM-ALBUTEROL 0.5-2.5 (3) MG/3ML IN SOLN
3.0000 mL | Freq: Once | RESPIRATORY_TRACT | Status: AC
Start: 2021-02-28 — End: 2021-02-28
  Filled 2021-02-28: qty 3

## 2021-02-28 MED ORDER — DEXAMETHASONE SODIUM PHOSPHATE 10 MG/ML IJ SOLN
10.0000 mg | Freq: Once | INTRAMUSCULAR | Status: AC
  Administered 2021-02-28: 10 mg via INTRAVENOUS
  Filled 2021-02-28: qty 1

## 2021-02-28 MED ORDER — IPRATROPIUM-ALBUTEROL 0.5-2.5 (3) MG/3ML IN SOLN
RESPIRATORY_TRACT | Status: AC
  Administered 2021-02-28: 3 mL via RESPIRATORY_TRACT
  Filled 2021-02-28: qty 6

## 2021-02-28 NOTE — ED Provider Notes (Signed)
[facility] Emergency Department Provider Note  ____________________________________________   Event Date/Time   First MD Initiated Contact with Patient 02/28/21 2256     (approximate)  I have reviewed the triage vital signs and the nursing notes.   HISTORY  Chief Complaint Shortness of Breath    HPI Jimmy Butler is a 87 y.o. male with medical history as listed below who presents for evaluation of gradually worsening shortness of breath over the last week.  He said he has had some problems with breathing, particular with exertion, for months if not years, but it is gotten considerably worse over the last week.  He also said that he aches all over his body.  He has had decreased appetite and has not been eating or drinking very well because he has been coughing and feeling generally weak and short of breath.  He reports that he is unvaccinated for COVID-19 and that he had a rapid test in the walk-in clinic within the last couple of days which is negative.  He describes that his symptoms are severe, made worse with exertion, and only slightly better with rest.  The patient denies fever/chills, sore throat, chest pain, nausea, vomiting, abdominal pain, and dysuria.  He has had no recent falls.     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  Prior to Admission medications   Medication Sig Start Date End Date Taking? Authorizing Provider  aspirin 325 MG tablet Take 325 mg by mouth daily.      [provider]  clopidogrel (PLAVIX) 75 MG tablet Take 75 mg by mouth daily.      [provider]  lisinopril-hydrochlorothiazide (PRINZIDE,ZESTORETIC) 20-12.5 MG per tablet Take 1 tablet by mouth daily.      [provider]  metoprolol tartrate (LOPRESSOR) 25 MG tablet Take 25 mg by mouth 2 (two) times daily.    [provider]  Misc Natural Products (GLUCOSAMINE CHONDROITIN ADV PO) Take 1 tablet by mouth 2 (two) times daily.     [provider]  Multiple Vitamins-Minerals (EYE VITAMINS PO) Take 1 tablet by mouth daily.     [provider]  rosuvastatin (CRESTOR) 10 MG tablet Take 5 mg by mouth daily.    [provider]  vitamin B-12 (CYANOCOBALAMIN) 1000 MCG tablet Take 1,000 mcg by mouth daily.      [provider]    Allergies Patient has no known allergies.  Family History  Problem Relation Age of Onset   Heart disease Father     Social History Social History   Tobacco Use   Smoking status: Former    Packs/day: 1.50    Years: 40.00    Pack years: 60.00    Types: Cigarettes  Quit date: 09/19/1968    Years since quitting: 52.4   Smokeless tobacco: Never  Substance Use Topics   Alcohol use: No   Drug use: No    Review of Systems Constitutional: No fever/chills.  Positive for generalized weakness. Eyes: No visual changes. ENT: No sore throat. Cardiovascular: Denies chest pain. Respiratory: Positive for shortness of breath and cough, worse with exertion, gradually worsening over the last week. Gastrointestinal: Decreased appetite and oral intake.  No  abdominal pain.  No nausea, no vomiting.  No diarrhea.  No constipation. Genitourinary: Negative for dysuria. Musculoskeletal: Generalized body aches.  Negative for neck pain.  Negative for back pain. Integumentary: Negative for rash. Neurological: Negative for headaches, focal weakness or numbness.   ____________________________________________   PHYSICAL EXAM:  VITAL SIGNS: ED Triage Vitals  Enc Vitals Group     BP 02/28/21 2137 (!) 144/55     Pulse Rate 02/28/21 2137 75     Resp 02/28/21 2137 (!) 24     Temp 02/28/21 2137 98.9 F (37.2 C)     Temp Source 02/28/21 2137 Oral     SpO2 02/28/21 2140 93 %     Weight 02/28/21 2136 120.7 kg (266 lb 1.5 oz)     Height 02/28/21 2136 1.702 m (5\' 7" )     Head Circumference --      Peak Flow --      Pain Score 02/28/21 2136 3     Pain Loc --      Pain Edu? --      Excl. in [place]? --     Constitutional: Alert and oriented.  Eyes: Conjunctivae are normal.  Head: Atraumatic. Nose: No congestion/rhinnorhea. Mouth/Throat: Patient is wearing a mask. Neck: No stridor.  No meningeal signs.   Cardiovascular: Normal rate, regular rhythm. Good peripheral circulation. Respiratory: Slightly increased respiratory rate and slightly increased work of breathing with some accessory muscle usage.  Frequent wheezing cough.  Upon auscultation with stethoscope, lung sounds are generally clear with no substantial abnormality.  Variable SPO2 from 90 to 96% on room air, always with a good waveform. Gastrointestinal: Soft and nontender. No distention.  Musculoskeletal: Trace pitting edema in bilateral lower extremities. No gross deformities of extremities. Neurologic:  Normal speech and language. No gross focal neurologic deficits are appreciated.  Skin:  Skin is warm, dry and intact. Psychiatric: Mood and affect are somewhat anxious and worried but generally normal.  ____________________________________________   LABS (all labs ordered are listed, but  only abnormal results are displayed)  Labs Reviewed  BASIC METABOLIC PANEL - Abnormal; Notable for the following components:      Result Value   Sodium 130 (*)    CO2 21 (*)    Glucose, Bld 143 (*)    BUN 65 (*)    Creatinine, Ser 1.97 (*)    Calcium 8.5 (*)    GFR, Estimated 32 (*)    All other components within normal limits  CBC - Abnormal; Notable for the following components:   RBC 3.67 (*)    Hemoglobin 11.4 (*)    HCT 33.4 (*)    Platelets 143 (*)    All other components within normal limits  TROPONIN I (HIGH SENSITIVITY) - Abnormal; Notable for the following components:   Troponin I (High Sensitivity) 40 (*)    All other components within normal limits  RESP PANEL BY RT-PCR (FLU A&B, COVID) ARPGX2  PROTIME-INR  BRAIN NATRIURETIC PEPTIDE  TROPONIN I (HIGH SENSITIVITY)   ____________________________________________  EKG  ED ECG REPORT I, Hinda Kehr, the attending physician, personally viewed and interpreted this ECG.  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 Narrative Interpretation: no definitive evidence of acute ischemia; does not meet STEMI criteria.  ____________________________________________  RADIOLOGY I, Hinda Kehr, personally viewed and evaluated these images (plain radiographs) as part of my medical decision making, as well as reviewing the written report by the radiologist.  ED MD interpretation: No obvious abnormality on chest x-ray  Official radiology report(s): DG Chest 2 View  Result Date: 02/28/2021 CLINICAL DATA:  Shortness of breath EXAM: CHEST - 2 VIEW COMPARISON:  01/12/2015 FINDINGS: Heart is normal size. No confluent airspace opacities or effusions. No acute bony abnormality. IMPRESSION: No active  cardiopulmonary disease. Electronically Signed   By: Rolm Baptise M.D.   On: 02/28/2021 22:07    ____________________________________________   PROCEDURES   Procedure(s) performed (including Critical Care):  .1-3 Lead EKG Interpretation  Date/Time: 02/28/2021 11:23 PM Performed by: Hinda Kehr, MD Authorized by: Hinda Kehr, MD     Interpretation: normal     ECG rate:  78   ECG rate assessment: normal     Rhythm: sinus rhythm     Ectopy: none     Conduction: normal   .Critical Care  Date/Time: 03/01/2021 4:45 AM Performed by: Hinda Kehr, MD Authorized by: Hinda Kehr, MD   Critical care provider statement:    Critical care time (minutes):  30   Critical care was necessary to treat or prevent imminent or life-threatening deterioration of the following conditions:  Respiratory failure (COVID-19)   ____________________________________________   INITIAL IMPRESSION / MDM / ASSESSMENT AND PLAN / ED COURSE  As part of my medical decision making, I reviewed the following data within the [place] notes reviewed and incorporated, Labs reviewed , EKG interpreted , Old chart reviewed, Radiograph reviewed , Discussed with admitting physician , and Notes from prior ED visits   Differential diagnosis includes, but is not limited to, COVID-19, COPD, new onset CHF, ACS, pneumonia, nonspecific viral illness.  The patient is on the cardiac monitor to evaluate for evidence of arrhythmia and/or significant heart rate changes.  Patient is unvaccinated for COVID-19 and is having some signs and symptoms suggestive of COVID.  His rapid test at the urgent care is likely to be nondiagnostic and I ordered a PCR swab tonight.  I personally reviewed the patient's imaging and agree with the radiologist's interpretation that there is no obvious abnormality on chest x-ray.  His vital signs are generally reassuring except that he is mildly tachypneic and his SPO2 tends  to be in the low 90s even while at rest even though sometimes when he takes deep breaths and coughs it will go up a little bit.  He is in mild respiratory distress at baseline.  I ordered 3 duo nebs to see if this will help.  He has no diagnosis of COPD but he is a former smoker and he sounds like he might benefit from some breathing treatments.  High-sensitivity troponin is slightly elevated at 40 and he is due for repeat now, but he has not had any chest pain and this may be indicative of demand ischemia or a result of the fact that he is having some  acute on  chronic kidney injury.  His baseline creatinine seems to be around 1.5 or so but he is at around 2 today and I suspect this is due to his decreased oral intake.  His sodium is slightly decreased at 130 but not substantially so.  His CBC is essentially normal.  Unclear disposition at this time but given his increased work of breathing and his low oxygenation as well as his age, anticipate he may require admission.  I will provide supplemental oxygen 2 L nasal cannula to help with his work of breathing along with the DuoNebs.  Also give a dose of Decadron 10 mg IV as this may be helpful whether this is COVID or other nonspecific respiratory issue particular given his wheezing cough.  Clinical Course as of 03/01/21 0445  Mon Mar 01, 2021  0038 The patient is COVID-positive.  This fits clinically.  Given his respiratory distress and COVID-positive (nonvaccinated) status, I ordered remdesivir per pharmacy consult and have consulted the hospitalist for admission. [CF]  0052 Discussed case with Dr. Roel Cluck who will admit [CF]    Clinical Course User Index [CF] Hinda Kehr, MD     ____________________________________________  FINAL CLINICAL IMPRESSION(S) / ED DIAGNOSES  Final diagnoses:  COVID-19  Acute renal failure superimposed on chronic kidney disease, unspecified CKD stage, unspecified acute renal failure type ([place])  Generalized  weakness     MEDICATIONS GIVEN DURING THIS VISIT:  Medications  remdesivir 200 mg in sodium chloride 0.9% 250 mL IVPB (0 mg Intravenous Stopped 03/01/21 0217)    Followed by  remdesivir 100 mg in sodium chloride 0.9 % 100 mL IVPB (has no administration in time range)  methylPREDNISolone sodium succinate (SOLU-MEDROL) 125 mg/2 mL injection 60.625 mg (has no administration in time range)    Followed by  predniSONE (DELTASONE) tablet 50 mg (has no administration in time range)  insulin aspart (novoLOG) injection 0-9 Units (2 Units Subcutaneous Given 03/01/21 0253)  aspirin EC tablet 325 mg (has no administration in time range)  clopidogrel (PLAVIX) tablet 75 mg (has no administration in time range)  bisoprolol (ZEBETA) tablet 5 mg (has no administration in time range)  rosuvastatin (CRESTOR) tablet 5 mg (has no administration in time range)  0.9 %  sodium chloride infusion (75 mL/hr Intravenous New Bag/Given 03/01/21 0349)  acetaminophen (TYLENOL) tablet 650 mg (has no administration in time range)    Or  acetaminophen (TYLENOL) suppository 650 mg (has no administration in time range)  HYDROcodone-acetaminophen (NORCO/VICODIN) 5-325 MG per tablet 1-2 tablet (has no administration in time range)  enoxaparin (LOVENOX) injection 60 mg (has no administration in time range)  ondansetron (ZOFRAN) tablet 4 mg (has no administration in time range)    Or  ondansetron (ZOFRAN) injection 4 mg (has no administration in time range)  albuterol (VENTOLIN HFA) 108 (90 Base) MCG/ACT inhaler 1-2 puff (has no administration in time range)  Ipratropium-Albuterol (COMBIVENT) respimat 1 puff (1 puff Inhalation Given 03/01/21 0254)  ipratropium-albuterol (DUONEB) 0.5-2.5 (3) MG/3ML nebulizer solution 3 mL (3 mLs Nebulization Given 02/28/21 2327)  ipratropium-albuterol (DUONEB) 0.5-2.5 (3) MG/3ML nebulizer solution 3 mL (3 mLs Nebulization Given 02/28/21 2320)  ipratropium-albuterol (DUONEB) 0.5-2.5 (3) MG/3ML  nebulizer solution 3 mL (3 mLs Nebulization Given 02/28/21 2331)  dexamethasone (DECADRON) injection 10 mg (10 mg Intravenous Given 02/28/21 2331)  sodium chloride 0.9 % bolus 500 mL (500 mLs Intravenous New Bag/Given 03/01/21 0118)     ED Discharge Orders     None  Note:  This document was prepared using Dragon voice recognition software and may include unintentional dictation errors.   Hinda Kehr, MD 03/01/21 [phone]

## 2021-02-28 NOTE — ED Notes (Signed)
Ex blue sent to lab

## 2021-02-28 NOTE — ED Triage Notes (Signed)
Pt reports that he is [facility] it began today. He was tested yesterday for COVID and it was negative. He developed a cough, he reports it productive but does not know that color because his eye sight is bad. Pt is also hard of hearing. He is able to speak in complete sentences. Lung sounds are clear on inspiration and diminished on expiration

## 2021-02-28 NOTE — ED Notes (Signed)
C/o nonproductive cough x1wk with general chest soreness

## 2021-03-01 ENCOUNTER — Encounter: Payer: Self-pay | Admitting: Internal Medicine

## 2021-03-01 DIAGNOSIS — I10 Essential (primary) hypertension: Secondary | ICD-10-CM

## 2021-03-01 DIAGNOSIS — N1832 Chronic kidney disease, stage 3b: Secondary | ICD-10-CM

## 2021-03-01 DIAGNOSIS — E871 Hypo-osmolality and hyponatremia: Secondary | ICD-10-CM | POA: Diagnosis present

## 2021-03-01 DIAGNOSIS — J441 Chronic obstructive pulmonary disease with (acute) exacerbation: Secondary | ICD-10-CM | POA: Diagnosis not present

## 2021-03-01 DIAGNOSIS — R531 Weakness: Secondary | ICD-10-CM

## 2021-03-01 DIAGNOSIS — E860 Dehydration: Secondary | ICD-10-CM | POA: Diagnosis present

## 2021-03-01 DIAGNOSIS — U071 COVID-19: Secondary | ICD-10-CM | POA: Diagnosis present

## 2021-03-01 DIAGNOSIS — E782 Mixed hyperlipidemia: Secondary | ICD-10-CM

## 2021-03-01 DIAGNOSIS — K219 Gastro-esophageal reflux disease without esophagitis: Secondary | ICD-10-CM

## 2021-03-01 DIAGNOSIS — N189 Chronic kidney disease, unspecified: Secondary | ICD-10-CM

## 2021-03-01 DIAGNOSIS — N179 Acute kidney failure, unspecified: Secondary | ICD-10-CM | POA: Diagnosis present

## 2021-03-01 DIAGNOSIS — E1122 Type 2 diabetes mellitus with diabetic chronic kidney disease: Secondary | ICD-10-CM

## 2021-03-01 DIAGNOSIS — N183 Chronic kidney disease, stage 3 unspecified: Secondary | ICD-10-CM | POA: Diagnosis present

## 2021-03-01 DIAGNOSIS — I2510 Atherosclerotic heart disease of native coronary artery without angina pectoris: Secondary | ICD-10-CM

## 2021-03-01 LAB — CBC WITH DIFFERENTIAL/PLATELET
Abs Immature Granulocytes: 0.08 10*3/uL — ABNORMAL HIGH (ref 0.00–0.07)
Basophils Absolute: 0 10*3/uL (ref 0.0–0.1)
Basophils Relative: 0 %
Eosinophils Absolute: 0 10*3/uL (ref 0.0–0.5)
Eosinophils Relative: 0 %
HCT: 33.2 % — ABNORMAL LOW (ref 39.0–52.0)
Hemoglobin: 11.1 g/dL — ABNORMAL LOW (ref 13.0–17.0)
Immature Granulocytes: 1 %
Lymphocytes Relative: 21 %
Lymphs Abs: 1.3 10*3/uL (ref 0.7–4.0)
MCH: 31 pg (ref 26.0–34.0)
MCHC: 33.4 g/dL (ref 30.0–36.0)
MCV: 92.7 fL (ref 80.0–100.0)
Monocytes Absolute: 0.3 10*3/uL (ref 0.1–1.0)
Monocytes Relative: 5 %
Neutro Abs: 4.5 10*3/uL (ref 1.7–7.7)
Neutrophils Relative %: 73 %
Platelets: 143 10*3/uL — ABNORMAL LOW (ref 150–400)
RBC: 3.58 MIL/uL — ABNORMAL LOW (ref 4.22–5.81)
RDW: 13 % (ref 11.5–15.5)
WBC: 6.3 10*3/uL (ref 4.0–10.5)
nRBC: 0 % (ref 0.0–0.2)

## 2021-03-01 LAB — COMPREHENSIVE METABOLIC PANEL
ALT: 35 U/L (ref 0–44)
AST: 52 U/L — ABNORMAL HIGH (ref 15–41)
Albumin: 3.6 g/dL (ref 3.5–5.0)
Alkaline Phosphatase: 65 U/L (ref 38–126)
Anion gap: 7 (ref 5–15)
BUN: 61 mg/dL — ABNORMAL HIGH (ref 8–23)
CO2: 24 mmol/L (ref 22–32)
Calcium: 8.6 mg/dL — ABNORMAL LOW (ref 8.9–10.3)
Chloride: 101 mmol/L (ref 98–111)
Creatinine, Ser: 1.88 mg/dL — ABNORMAL HIGH (ref 0.61–1.24)
GFR, Estimated: 34 mL/min — ABNORMAL LOW (ref >60)
Glucose, Bld: 180 mg/dL — ABNORMAL HIGH (ref 70–99)
Potassium: 5.4 mmol/L — ABNORMAL HIGH (ref 3.5–5.1)
Sodium: 132 mmol/L — ABNORMAL LOW (ref 135–145)
Total Bilirubin: 0.8 mg/dL (ref 0.3–1.2)
Total Protein: 7.1 g/dL (ref 6.5–8.1)

## 2021-03-01 LAB — GLUCOSE, CAPILLARY
Glucose-Capillary: 160 mg/dL — ABNORMAL HIGH (ref 70–99)
Glucose-Capillary: 262 mg/dL — ABNORMAL HIGH (ref 70–99)
Glucose-Capillary: 324 mg/dL — ABNORMAL HIGH (ref 70–99)
Glucose-Capillary: 288 mg/dL — ABNORMAL HIGH (ref 70–99)
Glucose-Capillary: 260 mg/dL — ABNORMAL HIGH (ref 70–99)

## 2021-03-01 LAB — LACTATE DEHYDROGENASE: LDH: 187 U/L (ref 98–192)

## 2021-03-01 LAB — RESP PANEL BY RT-PCR (FLU A&B, COVID) ARPGX2
Influenza A by PCR: NEGATIVE
Influenza B by PCR: NEGATIVE
SARS Coronavirus 2 by RT PCR: POSITIVE — AB

## 2021-03-01 LAB — CK: Total CK: 418 U/L — ABNORMAL HIGH (ref 49–397)

## 2021-03-01 LAB — HEMOGLOBIN A1C
Hgb A1c MFr Bld: 6.5 % — ABNORMAL HIGH (ref 4.8–5.6)
Mean Plasma Glucose: 140 mg/dL

## 2021-03-01 LAB — OSMOLALITY: Osmolality: 300 mOsm/kg — ABNORMAL HIGH (ref 275–295)

## 2021-03-01 LAB — FERRITIN: Ferritin: 655 ng/mL — ABNORMAL HIGH (ref 24–336)

## 2021-03-01 LAB — C-REACTIVE PROTEIN: CRP: 0.7 mg/dL (ref <1.0)

## 2021-03-01 LAB — D-DIMER, QUANTITATIVE: D-Dimer, Quant: 0.69 ug/mL-FEU — ABNORMAL HIGH (ref 0.00–0.50)

## 2021-03-01 LAB — TROPONIN I (HIGH SENSITIVITY): Troponin I (High Sensitivity): 40 ng/L — ABNORMAL HIGH (ref <18)

## 2021-03-01 LAB — TSH: TSH: 1.525 u[IU]/mL (ref 0.350–4.500)

## 2021-03-01 LAB — PROCALCITONIN: Procalcitonin: <0.1 ng/mL

## 2021-03-01 LAB — FIBRINOGEN: Fibrinogen: 455 mg/dL (ref 210–475)

## 2021-03-01 LAB — MAGNESIUM
Magnesium: 1.9 mg/dL (ref 1.7–2.4)
Magnesium: 1.9 mg/dL (ref 1.7–2.4)

## 2021-03-01 LAB — PHOSPHORUS: Phosphorus: 4 mg/dL (ref 2.5–4.6)

## 2021-03-01 MED ORDER — SODIUM CHLORIDE 0.9 % IV SOLN
200.0000 mg | Freq: Once | INTRAVENOUS | Status: AC
  Administered 2021-03-01: 200 mg via INTRAVENOUS
  Filled 2021-03-01: qty 200

## 2021-03-01 MED ORDER — IPRATROPIUM-ALBUTEROL 0.5-2.5 (3) MG/3ML IN SOLN: 3.0000 mL | Freq: Four times a day (QID) | RESPIRATORY_TRACT | Status: DC

## 2021-03-01 MED ORDER — CLOPIDOGREL BISULFATE 75 MG PO TABS
75.0000 mg | ORAL_TABLET | Freq: Every day | ORAL | Status: DC
  Administered 2021-03-01 – 2021-03-05 (×5): 75 mg via ORAL
  Filled 2021-03-01 (×5): qty 1

## 2021-03-01 MED ORDER — METHYLPREDNISOLONE SODIUM SUCC 125 MG IJ SOLR
0.5000 mg/kg | Freq: Two times a day (BID) | INTRAMUSCULAR | Status: AC
  Administered 2021-03-01 – 2021-03-03 (×6): 60.625 mg via INTRAVENOUS
  Filled 2021-03-01 (×6): qty 2

## 2021-03-01 MED ORDER — HYDROCOD POLST-CPM POLST ER 10-8 MG/5ML PO SUER
5.0000 mL | Freq: Two times a day (BID) | ORAL | Status: DC | PRN
  Administered 2021-03-01 – 2021-03-04 (×4): 5 mL via ORAL
  Filled 2021-03-01 (×4): qty 5

## 2021-03-01 MED ORDER — BISOPROLOL FUMARATE 5 MG PO TABS
5.0000 mg | ORAL_TABLET | Freq: Every day | ORAL | Status: DC
  Administered 2021-03-01 – 2021-03-04 (×3): 5 mg via ORAL
  Filled 2021-03-01 (×5): qty 1

## 2021-03-01 MED ORDER — SODIUM ZIRCONIUM CYCLOSILICATE 10 G PO PACK
10.0000 g | PACK | Freq: Once | ORAL | Status: AC
  Administered 2021-03-01: 10 g via ORAL
  Filled 2021-03-01: qty 1

## 2021-03-01 MED ORDER — ACETAMINOPHEN 650 MG RE SUPP
650.0000 mg | Freq: Four times a day (QID) | RECTAL | Status: DC | PRN
Start: 2021-03-01 — End: 2021-03-05

## 2021-03-01 MED ORDER — CHLORTHALIDONE 25 MG PO TABS: 25.0000 mg | ORAL_TABLET | Freq: Every day | ORAL | Status: DC

## 2021-03-01 MED ORDER — HYDROCODONE-ACETAMINOPHEN 5-325 MG PO TABS: 1.0000 | ORAL_TABLET | ORAL | Status: DC | PRN

## 2021-03-01 MED ORDER — PREDNISONE 50 MG PO TABS
50.0000 mg | ORAL_TABLET | Freq: Every day | ORAL | Status: DC
  Administered 2021-03-04 – 2021-03-05 (×2): 50 mg via ORAL
  Filled 2021-03-01 (×2): qty 1

## 2021-03-01 MED ORDER — ONDANSETRON HCL 4 MG PO TABS: 4.0000 mg | ORAL_TABLET | Freq: Four times a day (QID) | ORAL | Status: DC | PRN

## 2021-03-01 MED ORDER — ONDANSETRON HCL 4 MG/2ML IJ SOLN: 4.0000 mg | Freq: Four times a day (QID) | INTRAMUSCULAR | Status: DC | PRN

## 2021-03-01 MED ORDER — BENZONATATE 100 MG PO CAPS
100.0000 mg | ORAL_CAPSULE | Freq: Three times a day (TID) | ORAL | Status: DC | PRN
  Administered 2021-03-01 – 2021-03-04 (×6): 100 mg via ORAL
  Filled 2021-03-01 (×6): qty 1

## 2021-03-01 MED ORDER — FUROSEMIDE 40 MG PO TABS
40.0000 mg | ORAL_TABLET | Freq: Two times a day (BID) | ORAL | Status: DC
  Administered 2021-03-01 – 2021-03-04 (×7): 40 mg via ORAL
  Filled 2021-03-01 (×7): qty 1

## 2021-03-01 MED ORDER — SODIUM CHLORIDE 0.9 % IV BOLUS
500.0000 mL | Freq: Once | INTRAVENOUS | Status: AC
  Administered 2021-03-01: 500 mL via INTRAVENOUS

## 2021-03-01 MED ORDER — AMLODIPINE BESYLATE 10 MG PO TABS
10.0000 mg | ORAL_TABLET | Freq: Every day | ORAL | Status: DC
  Administered 2021-03-01 – 2021-03-05 (×3): 10 mg via ORAL
  Filled 2021-03-01 (×6): qty 1

## 2021-03-01 MED ORDER — SODIUM CHLORIDE 0.9 % IV SOLN
75.0000 mL/h | INTRAVENOUS | Status: AC
  Administered 2021-03-01: 75 mL/h via INTRAVENOUS

## 2021-03-01 MED ORDER — IPRATROPIUM-ALBUTEROL 20-100 MCG/ACT IN AERS
1.0000 | INHALATION_SPRAY | Freq: Four times a day (QID) | RESPIRATORY_TRACT | Status: DC
  Administered 2021-03-01 – 2021-03-05 (×19): 1 via RESPIRATORY_TRACT
  Filled 2021-03-01: qty 4

## 2021-03-01 MED ORDER — VITAMIN B-12 1000 MCG PO TABS
1000.0000 ug | ORAL_TABLET | Freq: Every day | ORAL | Status: DC
  Administered 2021-03-01 – 2021-03-05 (×5): 1000 ug via ORAL
  Filled 2021-03-01 (×5): qty 1

## 2021-03-01 MED ORDER — SODIUM CHLORIDE 0.9 % IV SOLN
100.0000 mg | Freq: Every day | INTRAVENOUS | Status: AC
  Administered 2021-03-02 – 2021-03-05 (×4): 100 mg via INTRAVENOUS
  Filled 2021-03-01 (×4): qty 100

## 2021-03-01 MED ORDER — ROSUVASTATIN CALCIUM 10 MG PO TABS
5.0000 mg | ORAL_TABLET | Freq: Every day | ORAL | Status: DC
  Administered 2021-03-01 – 2021-03-05 (×5): 5 mg via ORAL
  Filled 2021-03-01 (×5): qty 1

## 2021-03-01 MED ORDER — ACETAMINOPHEN 325 MG PO TABS
650.0000 mg | ORAL_TABLET | Freq: Four times a day (QID) | ORAL | Status: DC | PRN
  Administered 2021-03-02 – 2021-03-04 (×5): 650 mg via ORAL
  Filled 2021-03-01 (×5): qty 2

## 2021-03-01 MED ORDER — ALBUTEROL SULFATE (2.5 MG/3ML) 0.083% IN NEBU: 2.5000 mg | INHALATION_SOLUTION | RESPIRATORY_TRACT | Status: DC | PRN

## 2021-03-01 MED ORDER — ASPIRIN EC 325 MG PO TBEC
325.0000 mg | DELAYED_RELEASE_TABLET | Freq: Every day | ORAL | Status: DC
  Administered 2021-03-01 – 2021-03-05 (×5): 325 mg via ORAL
  Filled 2021-03-01 (×5): qty 1

## 2021-03-01 MED ORDER — ENOXAPARIN SODIUM 60 MG/0.6ML IJ SOSY
0.5000 mg/kg | PREFILLED_SYRINGE | INTRAMUSCULAR | Status: DC
  Administered 2021-03-01 – 2021-03-02 (×2): 60 mg via SUBCUTANEOUS
  Filled 2021-03-01 (×2): qty 0.6

## 2021-03-01 MED ORDER — ALBUTEROL SULFATE HFA 108 (90 BASE) MCG/ACT IN AERS
1.0000 | INHALATION_SPRAY | RESPIRATORY_TRACT | Status: DC | PRN
  Administered 2021-03-01 – 2021-03-05 (×9): 2 via RESPIRATORY_TRACT
  Filled 2021-03-01: qty 6.7

## 2021-03-01 MED ORDER — INSULIN ASPART 100 UNIT/ML IJ SOLN
0.0000 [IU] | INTRAMUSCULAR | Status: DC
  Administered 2021-03-01: 2 [IU] via SUBCUTANEOUS
  Administered 2021-03-01: 7 [IU] via SUBCUTANEOUS
  Administered 2021-03-01 (×3): 5 [IU] via SUBCUTANEOUS
  Administered 2021-03-02: 9 [IU] via SUBCUTANEOUS
  Administered 2021-03-02 (×2): 3 [IU] via SUBCUTANEOUS
  Administered 2021-03-02: 13:00:00 9 [IU] via SUBCUTANEOUS
  Administered 2021-03-02: 3 [IU] via SUBCUTANEOUS
  Administered 2021-03-02: 17:00:00 7 [IU] via SUBCUTANEOUS
  Administered 2021-03-03: 3 [IU] via SUBCUTANEOUS
  Administered 2021-03-03: 9 [IU] via SUBCUTANEOUS
  Administered 2021-03-03: 09:00:00 3 [IU] via SUBCUTANEOUS
  Administered 2021-03-03 (×2): 5 [IU] via SUBCUTANEOUS
  Administered 2021-03-04: 18:00:00 7 [IU] via SUBCUTANEOUS
  Administered 2021-03-04 (×2): 5 [IU] via SUBCUTANEOUS
  Administered 2021-03-04 (×2): 7 [IU] via SUBCUTANEOUS
  Filled 2021-03-01 (×20): qty 1

## 2021-03-01 NOTE — Progress Notes (Signed)
Inpatient Diabetes Program Recommendations  AACE/ADA: New Consensus Statement on Inpatient Glycemic Control   Target Ranges:  Prepandial:   less than 140 mg/dL      Peak postprandial:   less than 180 mg/dL (1-2 hours)      Critically ill patients:  140 - 180 mg/dL  Results for DEEN, DEGUIA (MRN [id number]) as of 03/01/2021 11:35  Ref. Range 03/01/2021 02:29 03/01/2021 08:12 03/01/2021 11:28  Glucose-Capillary Latest Ref Range: 70 - 99 mg/dL 160 (H) 262 (H) 324 (H)    Review of Glycemic Control  Diabetes history: DM2 Outpatient Diabetes medications: Trulicity 6.57 mg Qweek, Amaryl 2 mg daily, Metformin XR 500 mg daily Current orders for Inpatient glycemic control: Novolog 0-9 units Q4H; Solumedrol 60.625 mg Q12H  Inpatient Diabetes Program Recommendations:    Insulin: If steroids are continued, please consider ordering Levemir 10 units Q24H. If glucose continues to be consistently over 180 mg/dl with added Levemir and steroids are continued, may want to consider ordering Novolog 3 units TID with meals for meal coverage if patient eats at least 50% of meals.  Thanks, Barnie Alderman, RN, MSN, CDE Diabetes Coordinator Inpatient Diabetes Program [phone] (Team Pager from 8am to 5pm)

## 2021-03-01 NOTE — Evaluation (Signed)
Physical Therapy Evaluation Patient Details Name: Jimmy Butler MRN: [id number] DOB: 09-14-1933 Today's Date: 03/01/2021   History of Present Illness  Pt is an 87 y.o. male presenting to hospital 6/12 with gradually worsening SOB over the last week; body aches, decreased appetite, and weakness.  Pt admitted with COVID-19 infection, suspected COPD exacerbation, Mild AKI on CKD stage IIIb, hyperkalemia, peripheral edema, and generalized weakness.  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  Clinical Impression  Prior to hospital admission, pt was ambulatory (used SPC outside of home and no AD in home); lives with his grandson but grandson is not home often and can not assist.  Currently pt is SBA with transfers and CGA ambulating 100 feet with RW in room.  Limited distance ambulating d/t SOB (O2 sats 91% or greater on room air during sessions activities).  Pt would benefit from skilled PT to address noted impairments and functional limitations (see below for any additional details).  Upon hospital discharge, pt would benefit from [place] and support from family.    Follow Up Recommendations Home health PT    Equipment Recommendations  Rolling walker with 5" wheels;3in1 (PT)    Recommendations for Other Services OT consult     Precautions / Restrictions Precautions Precautions: Fall Restrictions Weight Bearing Restrictions: No      Mobility  Bed Mobility               General bed mobility comments: Deferred (pt in recliner beginning/end of session)    Transfers Overall transfer level: Needs assistance Equipment used: Rolling walker (2 wheeled) Transfers: Sit to/from Stand Sit to Stand: Supervision         General transfer comment: x5 trials standing from recliner  Ambulation/Gait Ambulation/Gait assistance: Min guard Gait Distance (Feet): 100 Feet (in room) Assistive device: Rolling walker (2  wheeled) Gait Pattern/deviations: Step-through pattern Gait velocity: mildly decreased   General Gait Details: steady with RW use; limited distance ambulating d/t SOB  Stairs            Wheelchair Mobility    Modified Rankin (Stroke Patients Only)       Balance Overall balance assessment: Needs assistance Sitting-balance support: No upper extremity supported;Feet supported Sitting balance-Leahy Scale: Normal Sitting balance - Comments: steady sitting reaching outside BOS   Standing balance support: Single extremity supported Standing balance-Leahy Scale: Fair Standing balance comment: pt requiring at least single UE support for static standing balance                             Pertinent Vitals/Pain Pain Assessment: Faces Faces Pain Scale: Hurts a little bit Pain Location: "catch" in R side when standing (for past 2-3 weeks) Pain Intervention(s): Limited activity within patient's tolerance;Monitored during session;Repositioned HR WFL during sessions activities.    Home Living Family/patient expects to be discharged to:: Private residence Living Arrangements: Other relatives (pt's grandson stays with pt but is not home often (can't assist pt)) Available Help at Discharge: Family;Available PRN/intermittently Type of Home: House Home Access: Stairs to enter;Ramped entrance   Entrance Stairs-Number of Steps: 4 STE B railings Home Layout: One level (with basement) Home Equipment: Cane - single point      Prior Function Level of Independence: Needs assistance   Gait / Transfers Assistance Needed: Ambulates with SPC outside but no AD in home.  1 fall backwards up steps in spring (no injury  reported)  ADL's / Homemaking Assistance Needed: Son in law takes pt to appts        Hand Dominance        Extremity/Trunk Assessment   Upper Extremity Assessment Upper Extremity Assessment: Generalized weakness    Lower Extremity Assessment Lower  Extremity Assessment: Generalized weakness    Cervical / Trunk Assessment Cervical / Trunk Assessment: Normal  Communication   Communication: HOH  Cognition Arousal/Alertness: Awake/alert Behavior During Therapy: WFL for tasks assessed/performed Overall Cognitive Status: Within Functional Limits for tasks assessed                                        General Comments  Nursing cleared pt for participation in physical therapy.  Per therapist in rounds, MD requesting therapy see pt (even with elevated potassium today).  Pt agreeable to PT session.  Pt incontinent of bowel (diarrhea) with coughing during session requiring assist for clean-up (nurse notified).    Exercises     Assessment/Plan    PT Assessment Patient needs continued PT services  PT Problem List Decreased strength;Decreased activity tolerance;Decreased balance;Decreased mobility;Decreased knowledge of use of DME;Cardiopulmonary status limiting activity       PT Treatment Interventions DME instruction;Gait training;Stair training;Functional mobility training;Therapeutic activities;Therapeutic exercise;Balance training;Patient/family education    PT Goals (Current goals can be found in the Care Plan section)  Acute Rehab PT Goals Patient Stated Goal: to go home PT Goal Formulation: With patient Time For Goal Achievement: 03/15/21 Potential to Achieve Goals: Good    Frequency Min 2X/week   Barriers to discharge        Co-evaluation               AM-PAC PT "6 Clicks" Mobility  Outcome Measure Help needed turning from your back to your side while in a flat bed without using bedrails?: None Help needed moving from lying on your back to sitting on the side of a flat bed without using bedrails?: None Help needed moving to and from a bed to a chair (including a wheelchair)?: A Little Help needed standing up from a chair using your arms (e.g., wheelchair or bedside chair)?: A Little Help  needed to walk in hospital room?: A Little Help needed climbing 3-5 steps with a railing? : A Little 6 Click Score: 20    End of Session Equipment Utilized During Treatment: Gait belt Activity Tolerance: Patient tolerated treatment well Patient left: in chair;with call bell/phone within reach;with chair alarm set Nurse Communication: Mobility status;Precautions;Other (comment) (pt's O2 sats during session) PT Visit Diagnosis: Other abnormalities of gait and mobility (R26.89);Muscle weakness (generalized) (M62.81);History of falling (Z91.81)    Time: 1332-1400 PT Time Calculation (min) (ACUTE ONLY): 28 min   Charges:   PT Evaluation $PT Eval Low Complexity: 1 Low PT Treatments $Therapeutic Activity: 8-22 mins       Leitha Bleak, PT 03/01/21, 2:33 PM

## 2021-03-01 NOTE — TOC Progression Note (Signed)
Transition of Care [facility]) - Progression Note    Patient Details  Name: Jimmy Butler MRN: [id number] Date of Birth: 1933-02-02  [facility]) CM/SW Narcissa, RN Phone Number: 03/01/2021, 9:33 AM  Clinical Narrative:   Patient is on COVID isolation, and unable to answer the phone due to illness.  RNCM spoke with daughter, Shawnee Knapp [phone].  Daughter states the following:  -patient lives alone (grandson stays there, but he is not at home often and is unable to assist patient) -Family and friends can only minimally assist if patient is discharged home -Son in law takes patient to appointments and to get medications without issue. -Daughter states patient takes meds as directed. -Although family can only minimally assist, they are hesitant to see patient at home due to [place]. -Patient currently has no home health services or DME.  TOC contact information given, TOC will follow through discharge for needs.          Expected Discharge Plan and Services                                                 Social Determinants of Health (SDOH) Interventions    Readmission Risk Interventions No flowsheet data found.

## 2021-03-01 NOTE — Progress Notes (Signed)
Remdesivir - Pharmacy Brief Note   O:  ALT:  CXR:  SpO2: 100 % on    A/P:  Remdesivir 200 mg IVPB once followed by 100 mg IVPB daily x 4 days.   Forrestine Lecrone D 03/01/2021 12:51 AM

## 2021-03-01 NOTE — ED Notes (Signed)
Pt made aware of covid status and admission, called son in law harrold and updated of same, placed pt on 2L per ed provider, sats at this time 95% RA but still has nonproductive cough

## 2021-03-01 NOTE — Progress Notes (Signed)
OT Cancellation Note  Patient Details Name: Jimmy Butler MRN: [id number] DOB: [birth year]   Cancelled Treatment:    Reason Eval/Treat Not Completed: Medical issues which prohibited therapy. Consult received, chart reviewed. Pt noted with elevated K+ (5.4 up from 4.8) this morning. Will hold OT evaluation and re-attempt at later date/time as medically appropriate.   Hanley Hays, MPH, MS, OTR/L ascom [phone] 03/01/21, 8:33 AM

## 2021-03-01 NOTE — Progress Notes (Addendum)
Progress Note    TREG DIEMER  ZOX:[id number] DOB: 1933/04/26  DOA: 02/28/2021 PCP: Josetta Huddle, MD      Brief Narrative:    Medical records reviewed and are as summarized below:  WELDEN HAUSMANN is a 87 y.o. male with past medical history significant for type II DM, hypertension, CAD, CAD, hyperlipidemia, COPD, CKD stage IIIb, who presented to the hospital with worsening cough and shortness of breath.  He tested positive for COVID-19 infection.  He was admitted to the hospital for COVID-19 infection, suspected COPD exacerbation and dehydration.  He was treated with IV remdesivir, steroids and IV fluids.      Assessment/Plan:   Active Problems:   Diabetes mellitus (HCC)   GERD (gastroesophageal reflux disease)   Coronary atherosclerosis of native coronary artery   Essential hypertension, benign   Mixed hyperlipidemia   COVID-19 virus infection   COPD with acute exacerbation (HCC)   Hyponatremia   AKI (acute kidney injury) ([place])   CKD (chronic kidney disease), stage III (HCC)   Dehydration     Body mass index is 39.19 kg/m.  (Morbid obesity)   COVID-19 infection and suspected COPD exacerbation: Continue bronchodilators, IV remdesivir and IV steroids.  Oxygen saturation was 92% on room air in the emergency room.  Mild AKI on CKD stage IIIb, peripheral edema, hyperkalemia: Substitute Lasix for chlorthalidone. Treat with Lokelma.  Hold telmisartan.  Repeat BMP tomorrow.  Type II DM: Hold Trulicity, glimepiride and metformin.  Use NovoLog as needed for hyperglycemia.  Hypertension: Continue antihypertensives.  CAD: Continue aspirin  Generalized weakness: PT and OT evaluation.   Diet Order             Diet Carb Modified Fluid consistency: Thin; Room service appropriate? Yes  Diet effective now                      Consultants: None  Procedures: None    Medications:    amLODipine  10 mg Oral Daily   aspirin EC  325 mg Oral Daily    bisoprolol  5 mg Oral Daily   chlorthalidone  25 mg Oral Daily   clopidogrel  75 mg Oral Daily   enoxaparin (LOVENOX) injection  0.5 mg/kg Subcutaneous Q24H   insulin aspart  0-9 Units Subcutaneous Q4H   Ipratropium-Albuterol  1 puff Inhalation Q6H   methylPREDNISolone (SOLU-MEDROL) injection  0.5 mg/kg Intravenous Q12H   Followed by   Derrill Memo ON 03/04/2021] predniSONE  50 mg Oral Daily   rosuvastatin  5 mg Oral Daily   sodium zirconium cyclosilicate  10 g Oral Once   vitamin B-12  1,000 mcg Oral Daily   Continuous Infusions:  sodium chloride 75 mL/hr (03/01/21 0349)   [START ON 03/02/2021] remdesivir 100 mg in NS 100 mL       Anti-infectives (From admission, onward)    Start     Dose/Rate Route Frequency Ordered Stop   03/02/21 1000  remdesivir 100 mg in sodium chloride 0.9 % 100 mL IVPB       See Hyperspace for full Linked Orders Report.   100 mg 200 mL/hr over 30 Minutes Intravenous Daily 03/01/21 0050 03/06/21 0959   03/01/21 0100  remdesivir 200 mg in sodium chloride 0.9% 250 mL IVPB       See Hyperspace for full Linked Orders Report.   200 mg 580 mL/hr over 30 Minutes Intravenous Once 03/01/21 0050 03/01/21 0217  Family Communication/Anticipated D/C date and plan/Code Status   DVT prophylaxis:      Code Status: Full Code  Family Communication: None Disposition Plan:    Status is: Observation  The patient will require care spanning > 2 midnights and should be moved to inpatient because: IV treatments appropriate due to intensity of illness or inability to take PO  Dispo: The patient is from: Home              Anticipated d/c is to: Home              Patient currently is not medically stable to d/c.   Difficult to place patient No           Subjective:   C/o cough and generalized weakness.  No shortness of breath or chest pain.  Objective:    Vitals:   02/28/21 2332 03/01/21 0118 03/01/21 0237 03/01/21 0700  BP: (!) 137/59  129/65 (!) 148/56 (!) 141/52  Pulse: 78 79 76   Resp: 19 19 20 18   Temp:  98.8 F (37.1 C) 98.5 F (36.9 C) 98.6 F (37 C)  TempSrc:  Oral Oral Oral  SpO2: 100% 97% 98% 97%  Weight:   113.5 kg   Height:   5\' 7"  (1.702 m)    No data found.   Intake/Output Summary (Last 24 hours) at 03/01/2021 1045 Last data filed at 03/01/2021 0915 Gross per 24 hour  Intake 790.81 ml  Output 600 ml  Net 190.81 ml   Filed Weights   02/28/21 2136 03/01/21 0237  Weight: 120.7 kg 113.5 kg    Exam:  GEN: NAD SKIN: No rash EYES: EOMI ENT: MMM CV: RRR PULM: CTA B ABD: soft, obese, NT, +BS CNS: AAO x 3, non focal EXT: Bilateral leg edema (2+), no tenderness        Data Reviewed:   I have personally reviewed following labs and imaging studies:  Labs: Labs show the following:   Basic Metabolic Panel: Recent Labs  Lab 02/28/21 2144 03/01/21 0248  NA 130* 132*  K 4.8 5.4*  CL 102 101  CO2 21* 24  GLUCOSE 143* 180*  BUN 65* 61*  CREATININE 1.97* 1.88*  CALCIUM 8.5* 8.6*  MG  --  1.9  1.9  PHOS  --  4.0   GFR Estimated Creatinine Clearance: 33.9 mL/min (A) (by C-G formula based on SCr of 1.88 mg/dL (H)). Liver Function Tests: Recent Labs  Lab 03/01/21 0248  AST 52*  ALT 35  ALKPHOS 65  BILITOT 0.8  PROT 7.1  ALBUMIN 3.6   No results for input(s): LIPASE, AMYLASE in the last 168 hours. No results for input(s): AMMONIA in the last 168 hours. Coagulation profile Recent Labs  Lab 02/28/21 2144  INR 1.0    CBC: Recent Labs  Lab 02/28/21 2144 03/01/21 0248  WBC 7.3 6.3  NEUTROABS  --  4.5  HGB 11.4* 11.1*  HCT 33.4* 33.2*  MCV 91.0 92.7  PLT 143* 143*   Cardiac Enzymes: Recent Labs  Lab 03/01/21 0248  CKTOTAL 418*   BNP (last 3 results) No results for input(s): PROBNP in the last 8760 hours. CBG: Recent Labs  Lab 03/01/21 0229 03/01/21 0812  GLUCAP 160* 262*   D-Dimer: Recent Labs    02/28/21 2326  DDIMER 0.69*   Hgb A1c: No results  for input(s): HGBA1C in the last 72 hours. Lipid Profile: No results for input(s): CHOL, HDL, LDLCALC, TRIG, CHOLHDL, LDLDIRECT in the last  72 hours. Thyroid function studies: Recent Labs    03/01/21 0248  TSH 1.525   Anemia work up: Recent Labs    02/28/21 2326  FERRITIN 655*   Sepsis Labs: Recent Labs  Lab 02/28/21 2144 02/28/21 2326 03/01/21 0248  PROCALCITON  --  <0.10  --   WBC 7.3  --  6.3    Microbiology Recent Results (from the past 240 hour(s))  Resp Panel by RT-PCR (Flu A&B, Covid) Nasopharyngeal Swab     Status: Abnormal   Collection Time: 02/28/21 11:26 PM   Specimen: Nasopharyngeal Swab; Nasopharyngeal(NP) swabs in vial transport medium  Result Value Ref Range Status   SARS Coronavirus 2 by RT PCR POSITIVE (A) NEGATIVE Final    Comment: RESULT CALLED TO, READ BACK BY AND VERIFIED WITH: Dee Givens @0028  on 03/01/21 SKL (NOTE) SARS-CoV-2 target nucleic acids are DETECTED.  The SARS-CoV-2 RNA is generally detectable in upper respiratory specimens during the acute phase of infection. Positive results are indicative of the presence of the identified virus, but do not rule out bacterial infection or co-infection with other pathogens not detected by the test. Clinical correlation with patient history and other diagnostic information is necessary to determine patient infection status. The expected result is Negative.  Fact Sheet for Patients: EntrepreneurPulse.com.au  Fact Sheet for Healthcare Providers: IncredibleEmployment.be  This test is not yet approved or cleared by the [place] FDA and  has been authorized for detection and/or diagnosis of SARS-CoV-2 by FDA under an Emergency Use Authorization (EUA).  This EUA will remain in effect (meaning this test can be u sed) for the duration of  the COVID-19 declaration under Section 564(b)(1) of the Act, 21 U.S.C. section 360bbb-3(b)(1), unless the authorization  is terminated or revoked sooner.     Influenza A by PCR NEGATIVE NEGATIVE Final   Influenza B by PCR NEGATIVE NEGATIVE Final    Comment: (NOTE) The Xpert Xpress SARS-CoV-2/FLU/RSV plus assay is intended as an aid in the diagnosis of influenza from Nasopharyngeal swab specimens and should not be used as a sole basis for treatment. Nasal washings and aspirates are unacceptable for Xpert Xpress SARS-CoV-2/FLU/RSV testing.  Fact Sheet for Patients: EntrepreneurPulse.com.au  Fact Sheet for Healthcare Providers: IncredibleEmployment.be  This test is not yet approved or cleared by the [place] FDA and has been authorized for detection and/or diagnosis of SARS-CoV-2 by FDA under an Emergency Use Authorization (EUA). This EUA will remain in effect (meaning this test can be used) for the duration of the COVID-19 declaration under Section 564(b)(1) of the Act, 21 U.S.C. section 360bbb-3(b)(1), unless the authorization is terminated or revoked.  Performed at [facility], [address] [place], [place] [address]     Procedures and diagnostic studies:  DG Chest 2 View  Result Date: 02/28/2021 CLINICAL DATA:  Shortness of breath EXAM: CHEST - 2 VIEW COMPARISON:  01/12/2015 FINDINGS: Heart is normal size. No confluent airspace opacities or effusions. No acute bony abnormality. IMPRESSION: No active cardiopulmonary disease. Electronically Signed   By: Rolm Baptise M.D.   On: 02/28/2021 22:07               LOS: 0 days   Satya Buttram  [facility]   Pager on [email or web address]. If 7PM-7AM, please contact night-coverage at [email or web address]     03/01/2021, 10:45 AM

## 2021-03-01 NOTE — H&P (Signed)
Jimmy Butler:[id number] DOB: 29-Oct-1932 DOA: 02/28/2021    PCP: Josetta Huddle, MD   Outpatient Specialists:  NONE    Patient arrived to ER on 02/28/21 at 2128 Referred by Attending Hinda Kehr, MD   Patient coming from: home Lives alone,      Chief Complaint:   Chief Complaint  Patient presents with   Shortness of Breath    HPI: Jimmy Butler is a 87 y.o. male with medical history significant of DM2, HTN, CKD stage 3b, CAD, obesity    Presented with   1 day hx of SOB  Tested yesterday for COVID and was negative but then developed a cough somewhat productive. Symptoms been going on for about a week.  He has chronic shortness of breath but is gotten worse.  Has had decreased appetite not eating and drinking as he supposed to.  He is not vaccinated for COVID No associated fevers or chills no shortness throat no nausea vomiting or diarrhea Former smoker not for the past 30 years Infectious risk factors:  Reports shortness of breath, dry cough,       Has  NOt been vaccinated against COVID     Initial COVID TEST    POSITIVE,     Lab Results  Component Value Date   Paulina (A) 02/28/2021     Regarding pertinent Chronic problems:     Hyperlipidemia -  on statins Crestor Lipid Panel  No results found for: CHOL, TRIG, HDL, CHOLHDL, VLDL, LDLCALC, LDLDIRECT, LABVLDL   HTN on lisinopril hydrochlorothiazide Lopressor     CAD  - On Aspirin, statin, betablocker, Plavix                 -     DM 2 -  PO meds only,        Morbid obesity-   BMI Readings from Last 1 Encounters:  02/28/21 41.68 kg/m   CKD stage IIIb- baseline Cr 1.5    Lab Results  Component Value Date   CREATININE 1.97 (H) 02/28/2021   CREATININE 1.46 (H) 02/21/2014   CREATININE 1.58 (H) 09/12/2009      While in ER: Troponin noted to be elevated at 40 Creatinine up from baseline sodium down from baseline from 132 down to 130 Chest x-ray unremarkable Tested  positive for COVID Given a dose of Decadron and nebulizer treatment  ED Triage Vitals  Enc Vitals Group     BP 02/28/21 2137 (!) 144/55     Pulse Rate 02/28/21 2137 75     Resp 02/28/21 2137 (!) 24     Temp 02/28/21 2137 98.9 F (37.2 C)     Temp Source 02/28/21 2137 Oral     SpO2 02/28/21 2140 93 %     Weight 02/28/21 2136 266 lb 1.5 oz (120.7 kg)     Height 02/28/21 2136 5\' 7"  (1.702 m)     Head Circumference --      Peak Flow --      Pain Score 02/28/21 2136 3     Pain Loc --      Pain Edu? --      Excl. in [place]? --   TMAX(24)@     _________________________________________ Significant initial  Findings: Abnormal Labs Reviewed  RESP PANEL BY RT-PCR (FLU A&B, COVID) ARPGX2 - Abnormal; Notable for the following components:      Result Value   SARS Coronavirus 2 by RT PCR POSITIVE (*)    All other components  within normal limits  BASIC METABOLIC PANEL - Abnormal; Notable for the following components:   Sodium 130 (*)    CO2 21 (*)    Glucose, Bld 143 (*)    BUN 65 (*)    Creatinine, Ser 1.97 (*)    Calcium 8.5 (*)    GFR, Estimated 32 (*)    All other components within normal limits  CBC - Abnormal; Notable for the following components:   RBC 3.67 (*)    Hemoglobin 11.4 (*)    HCT 33.4 (*)    Platelets 143 (*)    All other components within normal limits  BRAIN NATRIURETIC PEPTIDE - Abnormal; Notable for the following components:   B Natriuretic Peptide 134.3 (*)    All other components within normal limits  TROPONIN I (HIGH SENSITIVITY) - Abnormal; Notable for the following components:   Troponin I (High Sensitivity) 40 (*)    All other components within normal limits  TROPONIN I (HIGH SENSITIVITY) - Abnormal; Notable for the following components:   Troponin I (High Sensitivity) 40 (*)    All other components within normal limits   ____________________________________________ Ordered    CXR -  NON acute      _________________________ Troponin 40 40 ECG:  Ordered Personally reviewed by me showing: HR : 80 Rhythm:  NSR,   no evidence of ischemic changes QTC 418 ______________    The recent clinical data is shown below. Vitals:   02/28/21 2137 02/28/21 2140 02/28/21 2241 02/28/21 2332  BP: (!) 144/55  (!) 147/52 (!) 137/59  Pulse: 75  73 78  Resp: (!) 24  (!) 23 19  Temp: 98.9 F (37.2 C)     TempSrc: Oral     SpO2:  93% 92% 100%  Weight:      Height:          WBC     Component Value Date/Time   WBC 7.3 02/28/2021 2144   LYMPHSABS 2.6 06/28/2007 0915   MONOABS 0.9 (H) 06/28/2007 0915   EOSABS 0.5 06/28/2007 0915   BASOSABS 0.0 06/28/2007 0915    Procalcitonin  Ordered      UA   not ordered     Results for orders placed or performed during the hospital encounter of 02/28/21  Resp Panel by RT-PCR (Flu A&B, Covid) Nasopharyngeal Swab     Status: Abnormal   Collection Time: 02/28/21 11:26 PM   Specimen: Nasopharyngeal Swab; Nasopharyngeal(NP) swabs in vial transport medium  Result Value Ref Range Status   SARS Coronavirus 2 by RT PCR POSITIVE (A) NEGATIVE Final         Influenza A by PCR NEGATIVE NEGATIVE Final   Influenza B by PCR NEGATIVE NEGATIVE Final          _______________________________________________ Hospitalist was called for admission for COVID infection and AKI  The following Work up has been ordered so far:  Orders Placed This Encounter  Procedures   1-3 Lead EKG Interpretation   Resp Panel by RT-PCR (Flu A&B, Covid) Nasopharyngeal Swab   DG Chest 2 View   Basic metabolic panel   CBC   Protime-INR (order if Patient is taking Coumadin / Warfarin)   Brain natriuretic peptide   Document Height and Actual Weight   Cardiac monitoring   remdesivir per pharmacy consult   Consult to hospitalist   Airborne and Contact precautions   Pulse oximetry, continuous   EKG 12-Lead   ED EKG     Following Medications were ordered  in ER: Medications  ipratropium-albuterol (DUONEB) 0.5-2.5 (3) MG/3ML  nebulizer solution 3 mL (3 mLs Nebulization Given 02/28/21 2327)  ipratropium-albuterol (DUONEB) 0.5-2.5 (3) MG/3ML nebulizer solution 3 mL (3 mLs Nebulization Given 02/28/21 2320)  ipratropium-albuterol (DUONEB) 0.5-2.5 (3) MG/3ML nebulizer solution 3 mL (3 mLs Nebulization Given 02/28/21 2331)  dexamethasone (DECADRON) injection 10 mg (10 mg Intravenous Given 02/28/21 2331)        Consult Orders  (From admission, onward)           Start     Ordered   03/01/21 0039  Consult to hospitalist  Once       Provider:  (Not yet assigned)  Question Answer Comment  Place call to: hospitalist   Reason for Consult Admit   Diagnosis/Clinical Info for Consult: Evlyn Kanner, ED#[id number], [id number]     03/01/21 0038              OTHER Significant initial  Findings:  labs showing:    Recent Labs  Lab 02/28/21 2144  NA 130*  K 4.8  CO2 21*  GLUCOSE 143*  BUN 65*  CREATININE 1.97*  CALCIUM 8.5*    Cr    Up from baseline see below Lab Results  Component Value Date   CREATININE 1.97 (H) 02/28/2021   CREATININE 1.46 (H) 02/21/2014   CREATININE 1.58 (H) 09/12/2009    No results for input(s): AST, ALT, ALKPHOS, BILITOT, PROT, ALBUMIN in the last 168 hours. Lab Results  Component Value Date   CALCIUM 8.5 (L) 02/28/2021        Plt: Lab Results  Component Value Date   PLT 143 (L) 02/28/2021       COVID-19 Labs  No results for input(s): DDIMER, FERRITIN, LDH, CRP in the last 72 hours.  Lab Results  Component Value Date   SARSCOV2NAA POSITIVE (A) 02/28/2021        Recent Labs  Lab 02/28/21 2144  WBC 7.3  HGB 11.4*  HCT 33.4*  MCV 91.0  PLT 143*    HG/HCT   stable,     Component Value Date/Time   HGB 11.4 (L) 02/28/2021 2144   HCT 33.4 (L) 02/28/2021 2144   MCV 91.0 02/28/2021 2144       BNP (last 3 results) Recent Labs    02/28/21 2144  BNP 134.3*      DM  labs:  HbA1C: No results for input(s): HGBA1C in the last 8760 hours.     CBG (last  3)  No results for input(s): GLUCAP in the last 72 hours.        Cultures:    Component Value Date/Time   SDES URINE, CATHETERIZED 09/10/2009 1030   SPECREQUEST PT ON CIPRO AND ANCEF 09/10/2009 1030   CULT NO GROWTH 09/10/2009 1030   REPTSTATUS 09/12/2009 FINAL 09/10/2009 1030     Radiological Exams on Admission: DG Chest 2 View  Result Date: 02/28/2021 CLINICAL DATA:  Shortness of breath EXAM: CHEST - 2 VIEW COMPARISON:  01/12/2015 FINDINGS: Heart is normal size. No confluent airspace opacities or effusions. No acute bony abnormality. IMPRESSION: No active cardiopulmonary disease. Electronically Signed   By: Rolm Baptise M.D.   On: 02/28/2021 22:07   _______________________________________________________________________________________________________ Latest  Blood pressure (!) 137/59, pulse 78, temperature 98.9 F (37.2 C), temperature source Oral, resp. rate 19, height 5\' 7"  (1.702 m), weight 120.7 kg, SpO2 100 %.   Review of Systems:    Pertinent positives include:  chills, fatigue, loss of appetite,  hortness of breath at rest.  dyspnea on exertion,productive cough wheezing. Constitutional:  No weight loss, night sweats, Fevers weight loss  HEENT:  No headaches, Difficulty swallowing,Tooth/dental problems,Sore throat,  No sneezing, itching, ear ache, nasal congestion, post nasal drip,  Cardio-vascular:  No chest pain, Orthopnea, PND, anasarca, dizziness, palpitations.no Bilateral lower extremity swelling  GI:  No heartburn, indigestion, abdominal pain, nausea, vomiting, diarrhea, change in bowel habits, melena, blood in stool, hematemesis Resp:   No excess mucus, no , No non-productive cough, No coughing up of blood.No change in color of mucus.No  Skin:  no rash or lesions. No jaundice GU:  no dysuria, change in color of urine, no urgency or frequency. No straining to urinate.  No flank pain.  Musculoskeletal:  No joint pain or no joint swelling. No decreased range of  motion. No back pain.  Psych:  No change in mood or affect. No depression or anxiety. No memory loss.  Neuro: no localizing neurological complaints, no tingling, no weakness, no double vision, no gait abnormality, no slurred speech, no confusion  All systems reviewed and apart from [place] all are negative _______________________________________________________________________________________________ Past Medical History:   Past Medical History:  Diagnosis Date   Diabetes mellitus without complication ([place])       No past surgical history on file.  Social History:  Ambulatory  independently        reports that he quit smoking about 52 years ago. He has a 60.00 pack-year smoking history. He has never used smokeless tobacco. He reports that he does not drink alcohol and does not use drugs.     Family History:   Family History  Problem Relation Age of Onset   Heart disease Father    ______________________________________________________________________________________________ Allergies: No Known Allergies   Prior to Admission medications   Medication Sig Start Date End Date Taking? Authorizing Provider  aspirin 325 MG tablet Take 325 mg by mouth daily.      [provider]  clopidogrel (PLAVIX) 75 MG tablet Take 75 mg by mouth daily.      [provider]  lisinopril-hydrochlorothiazide (PRINZIDE,ZESTORETIC) 20-12.5 MG per tablet Take 1 tablet by mouth daily.      [provider]  metoprolol tartrate (LOPRESSOR) 25 MG tablet Take 25 mg by mouth 2 (two) times daily.    [provider]  Misc Natural Products (GLUCOSAMINE CHONDROITIN ADV PO) Take 1 tablet by mouth 2 (two) times daily.     [provider]  Multiple Vitamins-Minerals (EYE VITAMINS PO) Take 1 tablet by mouth daily.     [provider]  rosuvastatin (CRESTOR) 10 MG tablet Take 5 mg by mouth daily.    [provider]  vitamin B-12 (CYANOCOBALAMIN) 1000 MCG  tablet Take 1,000 mcg by mouth daily.      [provider]    ___________________________________________________________________________________________________ Physical Exam: Vitals with BMI 02/28/2021 02/28/2021 02/28/2021  Height - - -  Weight - - -  BMI - - -  Systolic 947 654 650  Diastolic 59 52 55  Pulse 78 73 75     1. General:  in No  Acute distress   Chronically ill   -appearing 2. Psychological: Alert and  Oriented 3. Head/ENT:   Dry Mucous Membranes                          Head Non traumatic, neck supple  Poor Dentition 4. SKIN: decreased Skin turgor,  Skin clean Dry and intact no rash 5. Heart: Regular rate and rhythm no Murmur, no Rub or gallop 6. Lungs: bilateral  wheezes or crackles   7. Abdomen: Soft,  non-tender, Non distended obese   8. Lower extremities: no clubbing, cyanosis, trace edema 9. Neurologically Grossly intact, moving all 4 extremities equally   10. MSK: Normal range of motion    Chart has been reviewed  ______________________________________________________________________________________________  Assessment/Plan  87 y.o. male with medical history significant of DM2, HTN, CKD stage 3b, CAD, obesity Admitted for  COVID infection and AKI  Present on Admission:   COVID-19 virus infection -patient was started on remdesivir given a dose of Decadron. Will change to Solu-Medrol. Suspect COPD exacerbation is playing a role.  The patient is clearly dehydrated from poor p.o. intake.  We will give gentle IV fluids avoid aggressive fluid resuscitation. Obtain inflammatory markers Provide oxygen as needed order flutter valve ordered sputum cultures Treat underlying COPD exacerbation Hold off on antibiotics unless evidence of secondary infection  Mixed hyperlipidemia -chronic stable continue home medications    GERD (gastroesophageal reflux disease) continue PPI   Essential hypertension, benign change metoprolol to  Zebeta given worsening renal function hold lisinopril   Coronary atherosclerosis of native coronary artery chronic stable continue aspirin Plavix Statin and beta-blocker   COPD with acute exacerbation ([place]) -   Will initiate: Steroid taper - Albuterol  PRN, - scheduled duoneb,  -  Breo or Dulera at discharge  Titrate O2 to saturation >90%. Follow patients respiratory status.  Currently mentating well no evidence of symptomatic hypercarbia   Hyponatremia - obtain urine electrolytes   AKI (acute kidney injury) ([place])- likely due to dehydration will rehydrate and follow   CKD (chronic kidney disease), stage III ([facility]) -  -chronic avoid nephrotoxic medications such as NSAIDs, Vanco Zosyn combo,  avoid hypotension, continue to follow renal function   Dehydration - gently rehydrate  DM 2-  - Order SSI    -  check TSH and HgA1C      Other plan as per orders.  DVT prophylaxis:   Lovenox       Code Status:    Code Status: Not on file FULL CODE  care as per patient    I had personally discussed CODE STATUS with patient      Family Communication:   Family not at  Bedside    Disposition Plan:      To home once workup is complete and patient is stable   Following barriers for discharge:                            Electrolytes corrected                                                            Will need to be able to tolerate PO                            Will likely need home health, home O2, set up  Consults called: none  Admission status:  ED Disposition     ED Disposition  Admit   Condition  --   [place]: [place] [[id number]]  Level of Care: Med-Surg [[id number]]  Covid Evaluation: Confirmed COVID Positive  Diagnosis: COVID-19 virus infection [[id number]]  Admitting Physician: Toy Baker [[id number]]  Attending Physician: Toy Baker [[id number]]           Obs      Level of care   tele     indefinitely please discontinue once patient no longer qualifies COVID-19 Labs    Lab Results  Component Value Date   [place] (A) 02/28/2021     Precautions: admitted as   covid positive Airborne and Contact precautions     PPE: Used by the provider:    P100  eye Goggles,  Gloves  gown     Serafina Topham 03/01/2021, 1:54 AM    [facility]     after 2 AM please page floor coverage PA If 7AM-7PM, please contact the day team taking care of the patient using [email or web address]   Patient was evaluated in the context of the global COVID-19 pandemic, which necessitated consideration that the patient might be at risk for infection with the SARS-CoV-2 virus that causes COVID-19. Institutional protocols and algorithms that pertain to the evaluation of patients at risk for COVID-19 are in a state of rapid change based on information released by regulatory bodies including the CDC and federal and state organizations. These policies and algorithms were followed during the patient's care.

## 2021-03-01 NOTE — Progress Notes (Signed)
Anticoagulation monitoring(Lovenox):  87 yo male ordered Lovenox 30 mg Q24h    Filed Weights   02/28/21 2136  Weight: 120.7 kg (266 lb 1.5 oz)   BMI 41.68    Lab Results  Component Value Date   CREATININE 1.97 (H) 02/28/2021   CREATININE 1.46 (H) 02/21/2014   CREATININE 1.58 (H) 09/12/2009   Estimated Creatinine Clearance: 33.5 mL/min (A) (by C-G formula based on SCr of 1.97 mg/dL (H)). Hemoglobin & Hematocrit     Component Value Date/Time   HGB 11.4 (L) 02/28/2021 2144   HCT 33.4 (L) 02/28/2021 2144     Per Protocol for Patient with estCrcl > 30 ml/min and BMI > 30, will transition to Lovenox 60 mg Q24h.

## 2021-03-01 NOTE — Evaluation (Signed)
Occupational Therapy Evaluation Patient Details Name: Jimmy Butler MRN: [id number] DOB: [birth year] Today's Date: 03/01/2021    History of Present Illness Pt is an 87 y.o. male presenting to hospital 6/12 with gradually worsening SOB over the last week; body aches, decreased appetite, and weakness.  Pt admitted with COVID-19 infection, suspected COPD exacerbation, Mild AKI on CKD stage IIIb, hyperkalemia, peripheral edema, and generalized weakness.  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   Clinical Impression   Pt seen for OT evaluation this date. Pt pleasant and appreciative for assist and education provided this date. Pt from home, essentially lives by himself (grandson in and out to sleep and shower). Pt reports his daughter assists with groceries, son in law takes to doctor appts, and otherwise pt remains at home and does not leave. Pt denies pain or SOB. Pt demonstrates generalized weakness and impaired activity tolerance and balance during toileting via BSC next to recliner. Pt performed transfers with a RW with supervision for safety. When coughing had difficulty with loose bowels. OT cleaned floor and replaced socks once pt was on BSC. Pt reports difficulty with LB dressing and pericare at home 2/2 difficulty with reaching. Pt educated in use of reacher for LB dressing and a toileting aide for pericare. Pt very interested. Pt educated in energy conservation strategies to support participation in ADL/IADL while minimizing over exertion and SOB. Pt verbalized understanding. Pt will benefit from additional skilled OT services to maximize return to PLOF. Recommend HHOT services at discharge.     Follow Up Recommendations  Home health OT   Equipment Recommendations  3 in 1 bedside commode;Other (comment) (toileting aide)    Recommendations for Other Services       Precautions / Restrictions  Precautions Precautions: Fall Precaution Comments: air/contact 2/2 Covid Restrictions Weight Bearing Restrictions: No      Mobility Bed Mobility               General bed mobility comments: Deferred (pt in recliner beginning/end of session)    Transfers Overall transfer level: Needs assistance Equipment used: Rolling walker (2 wheeled) Transfers: Sit to/from Stand Sit to Stand: Supervision         General transfer comment: x5 trials standing from recliner    Balance Overall balance assessment: Needs assistance Sitting-balance support: No upper extremity supported;Feet supported Sitting balance-Leahy Scale: Good Sitting balance - Comments: steady sitting reaching outside BOS   Standing balance support: Bilateral upper extremity supported;During functional activity Standing balance-Leahy Scale: Fair Standing balance comment: pt requiring at least single UE support for static standing balance                           ADL either performed or assessed with clinical judgement   ADL Overall ADL's : Needs assistance/impaired                                       General ADL Comments: Pt required MAX A for doffing/donning socks and pericare during BSC toileting. Pt had loose bowels each time he coughed and some ended up on the floor prior to him getting to [facility] next to recliner.     Vision Patient Visual Report: No change from baseline       Perception     Praxis  Pertinent Vitals/Pain Pain Assessment: No/denies pain Faces Pain Scale: Hurts a little bit Pain Location: "catch" in R side when standing (for past 2-3 weeks) Pain Intervention(s): Limited activity within patient's tolerance;Monitored during session;Repositioned     Hand Dominance     Extremity/Trunk Assessment Upper Extremity Assessment Upper Extremity Assessment: Generalized weakness   Lower Extremity Assessment Lower Extremity Assessment: Generalized weakness    Cervical / Trunk Assessment Cervical / Trunk Assessment: Normal   Communication Communication Communication: HOH   Cognition Arousal/Alertness: Awake/alert Behavior During Therapy: WFL for tasks assessed/performed Overall Cognitive Status: Within Functional Limits for tasks assessed                                     General Comments       Exercises Other Exercises Other Exercises: Pt educated in using reacher for LB dressing and toileting aide for pericare, pt very interested and appreciative of education Other Exercises: Pt educated in energy conservation strategies to support ADL/IADL participation while minimizing over exertion and SOB   Shoulder Instructions      Home Living Family/patient expects to be discharged to:: Private residence Living Arrangements: Other relatives (pt's grandson stays with pt but is not home often (can't assist pt)) Available Help at Discharge: Family;Available PRN/intermittently Type of Home: House Home Access: Stairs to enter;Ramped entrance Entrance Stairs-Number of Steps: 4 STE B railings   Home Layout: One level (with basement)               Home Equipment: Cane - single point          Prior Functioning/Environment Level of Independence: Needs assistance  Gait / Transfers Assistance Needed: Ambulates with SPC outside but no AD in home.  1 fall backwards up steps in spring (no injury reported) ADL's / Homemaking Assistance Needed: Son in law takes pt to appts, pt was modified indep with toileting (used towel between his legs for pericare 2/2 difficulty reaching), long handled sponge when showering, able to prep meals, dtr assists with groceries            OT Problem List: Decreased strength;Decreased activity tolerance;Impaired balance (sitting and/or standing);Decreased knowledge of use of DME or AE      OT Treatment/Interventions: Self-care/ADL training;Therapeutic exercise;Therapeutic activities;DME and/or  AE instruction;Energy conservation;Patient/family education;Balance training    OT Goals(Current goals can be found in the care plan section) Acute Rehab OT Goals Patient Stated Goal: to go home OT Goal Formulation: With patient Time For Goal Achievement: 03/15/21 Potential to Achieve Goals: Good ADL Goals Pt Will Perform Lower Body Dressing: with modified independence;with adaptive equipment;sit to/from stand Pt Will Transfer to Toilet: with modified independence;ambulating;bedside commode (LRAD) Pt Will Perform Toileting - Clothing Manipulation and hygiene: with modified independence;with adaptive equipment;sit to/from stand (using toileting aide) Additional ADL Goal #1: Pt will verbalize plan to implement at least 2 learned ECS to support ADL/mobility/IADL participation while minimizing SOB/over exertion.  OT Frequency: Min 2X/week   Barriers to D/C: Decreased caregiver support          Co-evaluation              AM-PAC OT "6 Clicks" Daily Activity     Outcome Measure Help from another person eating meals?: None Help from another person taking care of personal grooming?: None Help from another person toileting, which includes using toliet, bedpan, or urinal?: A Lot Help from another person bathing (including washing,  rinsing, drying)?: A Lot Help from another person to put on and taking off regular upper body clothing?: None Help from another person to put on and taking off regular lower body clothing?: A Lot 6 Click Score: 18   End of Session    Activity Tolerance: Patient tolerated treatment well Patient left: in chair;with call bell/phone within reach;with chair alarm set  OT Visit Diagnosis: Other abnormalities of gait and mobility (R26.89);Muscle weakness (generalized) (M62.81)                Time: 1527-1610 OT Time Calculation (min): 43 min Charges:  OT General Charges $OT Visit: 1 Visit OT Evaluation $OT Eval Moderate Complexity: 1 Mod OT Treatments $Self  Care/Home Management : 38-52 mins  Hanley Hays, MPH, MS, OTR/L ascom [phone] 03/01/21, 5:17 PM

## 2021-03-02 DIAGNOSIS — K219 Gastro-esophageal reflux disease without esophagitis: Secondary | ICD-10-CM | POA: Diagnosis present

## 2021-03-02 DIAGNOSIS — I2510 Atherosclerotic heart disease of native coronary artery without angina pectoris: Secondary | ICD-10-CM | POA: Diagnosis present

## 2021-03-02 DIAGNOSIS — U071 COVID-19: Secondary | ICD-10-CM | POA: Diagnosis present

## 2021-03-02 DIAGNOSIS — Z8249 Family history of ischemic heart disease and other diseases of the circulatory system: Secondary | ICD-10-CM | POA: Diagnosis not present

## 2021-03-02 DIAGNOSIS — E782 Mixed hyperlipidemia: Secondary | ICD-10-CM | POA: Diagnosis present

## 2021-03-02 DIAGNOSIS — Z28310 Unvaccinated for covid-19: Secondary | ICD-10-CM | POA: Diagnosis not present

## 2021-03-02 DIAGNOSIS — I129 Hypertensive chronic kidney disease with stage 1 through stage 4 chronic kidney disease, or unspecified chronic kidney disease: Secondary | ICD-10-CM | POA: Diagnosis present

## 2021-03-02 DIAGNOSIS — E1122 Type 2 diabetes mellitus with diabetic chronic kidney disease: Secondary | ICD-10-CM | POA: Diagnosis present

## 2021-03-02 DIAGNOSIS — E875 Hyperkalemia: Secondary | ICD-10-CM | POA: Diagnosis present

## 2021-03-02 DIAGNOSIS — R531 Weakness: Secondary | ICD-10-CM | POA: Diagnosis present

## 2021-03-02 DIAGNOSIS — Z7982 Long term (current) use of aspirin: Secondary | ICD-10-CM | POA: Diagnosis not present

## 2021-03-02 DIAGNOSIS — K5900 Constipation, unspecified: Secondary | ICD-10-CM | POA: Diagnosis present

## 2021-03-02 DIAGNOSIS — E860 Dehydration: Secondary | ICD-10-CM | POA: Diagnosis present

## 2021-03-02 DIAGNOSIS — E1165 Type 2 diabetes mellitus with hyperglycemia: Secondary | ICD-10-CM | POA: Diagnosis present

## 2021-03-02 DIAGNOSIS — Z7902 Long term (current) use of antithrombotics/antiplatelets: Secondary | ICD-10-CM | POA: Diagnosis not present

## 2021-03-02 DIAGNOSIS — N179 Acute kidney failure, unspecified: Secondary | ICD-10-CM | POA: Diagnosis present

## 2021-03-02 DIAGNOSIS — J441 Chronic obstructive pulmonary disease with (acute) exacerbation: Secondary | ICD-10-CM | POA: Diagnosis present

## 2021-03-02 DIAGNOSIS — Z87891 Personal history of nicotine dependence: Secondary | ICD-10-CM | POA: Diagnosis not present

## 2021-03-02 DIAGNOSIS — Z79899 Other long term (current) drug therapy: Secondary | ICD-10-CM | POA: Diagnosis not present

## 2021-03-02 DIAGNOSIS — N1832 Chronic kidney disease, stage 3b: Secondary | ICD-10-CM | POA: Diagnosis present

## 2021-03-02 DIAGNOSIS — I10 Essential (primary) hypertension: Secondary | ICD-10-CM | POA: Diagnosis not present

## 2021-03-02 DIAGNOSIS — Z6841 Body Mass Index (BMI) 40.0 and over, adult: Secondary | ICD-10-CM | POA: Diagnosis not present

## 2021-03-02 DIAGNOSIS — E871 Hypo-osmolality and hyponatremia: Secondary | ICD-10-CM | POA: Diagnosis present

## 2021-03-02 LAB — CBC WITH DIFFERENTIAL/PLATELET
Abs Immature Granulocytes: 0.09 10*3/uL — ABNORMAL HIGH (ref 0.00–0.07)
Basophils Absolute: 0 10*3/uL (ref 0.0–0.1)
Basophils Relative: 0 %
Eosinophils Absolute: 0 10*3/uL (ref 0.0–0.5)
Eosinophils Relative: 0 %
HCT: 33.4 % — ABNORMAL LOW (ref 39.0–52.0)
Hemoglobin: 11.6 g/dL — ABNORMAL LOW (ref 13.0–17.0)
Immature Granulocytes: 1 %
Lymphocytes Relative: 28 %
Lymphs Abs: 1.8 10*3/uL (ref 0.7–4.0)
MCH: 31.4 pg (ref 26.0–34.0)
MCHC: 34.7 g/dL (ref 30.0–36.0)
MCV: 90.5 fL (ref 80.0–100.0)
Monocytes Absolute: 0.8 10*3/uL (ref 0.1–1.0)
Monocytes Relative: 13 %
Neutro Abs: 3.7 10*3/uL (ref 1.7–7.7)
Neutrophils Relative %: 58 %
Platelets: 157 10*3/uL (ref 150–400)
RBC: 3.69 MIL/uL — ABNORMAL LOW (ref 4.22–5.81)
RDW: 13.1 % (ref 11.5–15.5)
WBC: 6.3 10*3/uL (ref 4.0–10.5)
nRBC: 0 % (ref 0.0–0.2)

## 2021-03-02 LAB — COMPREHENSIVE METABOLIC PANEL
ALT: 33 U/L (ref 0–44)
AST: 42 U/L — ABNORMAL HIGH (ref 15–41)
Albumin: 3.6 g/dL (ref 3.5–5.0)
Alkaline Phosphatase: 62 U/L (ref 38–126)
Anion gap: 10 (ref 5–15)
BUN: 73 mg/dL — ABNORMAL HIGH (ref 8–23)
CO2: 25 mmol/L (ref 22–32)
Calcium: 9.1 mg/dL (ref 8.9–10.3)
Chloride: 100 mmol/L (ref 98–111)
Creatinine, Ser: 1.88 mg/dL — ABNORMAL HIGH (ref 0.61–1.24)
GFR, Estimated: 34 mL/min — ABNORMAL LOW (ref >60)
Glucose, Bld: 227 mg/dL — ABNORMAL HIGH (ref 70–99)
Potassium: 4.5 mmol/L (ref 3.5–5.1)
Sodium: 135 mmol/L (ref 135–145)
Total Bilirubin: 0.7 mg/dL (ref 0.3–1.2)
Total Protein: 6.9 g/dL (ref 6.5–8.1)

## 2021-03-02 LAB — GLUCOSE, CAPILLARY
Glucose-Capillary: 215 mg/dL — ABNORMAL HIGH (ref 70–99)
Glucose-Capillary: 216 mg/dL — ABNORMAL HIGH (ref 70–99)
Glucose-Capillary: 239 mg/dL — ABNORMAL HIGH (ref 70–99)
Glucose-Capillary: 304 mg/dL — ABNORMAL HIGH (ref 70–99)
Glucose-Capillary: 356 mg/dL — ABNORMAL HIGH (ref 70–99)
Glucose-Capillary: 359 mg/dL — ABNORMAL HIGH (ref 70–99)

## 2021-03-02 LAB — C-REACTIVE PROTEIN: CRP: 0.6 mg/dL (ref <1.0)

## 2021-03-02 LAB — FERRITIN: Ferritin: 699 ng/mL — ABNORMAL HIGH (ref 24–336)

## 2021-03-02 LAB — MAGNESIUM: Magnesium: 2.1 mg/dL (ref 1.7–2.4)

## 2021-03-02 LAB — PHOSPHORUS: Phosphorus: 5.2 mg/dL — ABNORMAL HIGH (ref 2.5–4.6)

## 2021-03-02 LAB — D-DIMER, QUANTITATIVE: D-Dimer, Quant: 0.51 ug/mL-FEU — ABNORMAL HIGH (ref 0.00–0.50)

## 2021-03-02 MED ORDER — INSULIN GLARGINE 100 UNIT/ML ~~LOC~~ SOLN
12.0000 [IU] | Freq: Every day | SUBCUTANEOUS | Status: DC
  Administered 2021-03-02: 12 [IU] via SUBCUTANEOUS
  Filled 2021-03-02 (×2): qty 0.12

## 2021-03-02 MED ORDER — INSULIN ASPART 100 UNIT/ML IJ SOLN
4.0000 [IU] | Freq: Three times a day (TID) | INTRAMUSCULAR | Status: DC
  Administered 2021-03-02: 17:00:00 4 [IU] via SUBCUTANEOUS
  Filled 2021-03-02: qty 1

## 2021-03-02 MED ORDER — POLYETHYLENE GLYCOL 3350 17 G PO PACK
17.0000 g | PACK | Freq: Every day | ORAL | Status: DC
  Administered 2021-03-02 – 2021-03-05 (×4): 17 g via ORAL
  Filled 2021-03-02 (×4): qty 1

## 2021-03-02 MED ORDER — SENNA 8.6 MG PO TABS
1.0000 | ORAL_TABLET | Freq: Every day | ORAL | Status: DC | PRN
  Administered 2021-03-02: 8.6 mg via ORAL
  Filled 2021-03-02: qty 1

## 2021-03-02 MED ORDER — ENOXAPARIN SODIUM 60 MG/0.6ML IJ SOSY: 0.5000 mg/kg | PREFILLED_SYRINGE | INTRAMUSCULAR | Status: DC

## 2021-03-02 NOTE — Progress Notes (Signed)
Progress Note    Jimmy Butler  NIO:[id number] DOB: [birth year]  DOA: 02/28/2021 PCP: Josetta Huddle, MD      Brief Narrative:    Medical records reviewed and are as summarized below:  Jimmy Butler is a 87 y.o. male with past medical history significant for type II DM, hypertension, CAD, CAD, hyperlipidemia, COPD, CKD stage IIIb, who presented to the hospital with worsening cough and shortness of breath.  He tested positive for COVID-19 infection.  He was admitted to the hospital for COVID-19 infection, suspected COPD exacerbation and dehydration.  He was treated with IV remdesivir, steroids and IV fluids.      Assessment/Plan:   Active Problems:   Diabetes mellitus (HCC)   GERD (gastroesophageal reflux disease)   Coronary atherosclerosis of native coronary artery   Essential hypertension, benign   Mixed hyperlipidemia   COVID-19 virus infection   COPD with acute exacerbation (HCC)   Hyponatremia   AKI (acute kidney injury) ([place])   CKD (chronic kidney disease), stage III (HCC)   Dehydration     Body mass index is 39.19 kg/m.  (Morbid obesity)   COVID-19 infection and suspected COPD exacerbation: Continue bronchodilators, IV remdesivir and IV steroids. Oxygen saturation was 92% on room air in the emergency room.  CKD stage IIIb, peripheral edema, hyperkalemia: Hyperkalemia has resolved.  Creatinine may be at baseline.  Continue Lasix.  Repeat BMP tomorrow.  Type II DM with hyperglycemia: Start Lantus and scheduled NovoLog for glucose control.  Continue NovoLog as needed for hyperglycemia.  Hold Trulicity, glimepiride and metformin.  Hypertension: Continue antihypertensives.  CAD: Continue aspirin  Generalized weakness: PT and OT recommend home health therapy   Diet Order             Diet Carb Modified Fluid consistency: Thin; Room service appropriate? Yes  Diet effective now                       Consultants: None  Procedures: None    Medications:    amLODipine  10 mg Oral Daily   aspirin EC  325 mg Oral Daily   bisoprolol  5 mg Oral Daily   clopidogrel  75 mg Oral Daily   enoxaparin (LOVENOX) injection  0.5 mg/kg Subcutaneous Q24H   furosemide  40 mg Oral BID   insulin aspart  0-9 Units Subcutaneous Q4H   insulin glargine  12 Units Subcutaneous Daily   Ipratropium-Albuterol  1 puff Inhalation Q6H   methylPREDNISolone (SOLU-MEDROL) injection  0.5 mg/kg Intravenous Q12H   Followed by   Derrill Memo ON 03/04/2021] predniSONE  50 mg Oral Daily   rosuvastatin  5 mg Oral Daily   vitamin B-12  1,000 mcg Oral Daily   Continuous Infusions:  remdesivir 100 mg in NS 100 mL 100 mg (03/02/21 0857)     Anti-infectives (From admission, onward)    Start     Dose/Rate Route Frequency Ordered Stop   03/02/21 1000  remdesivir 100 mg in sodium chloride 0.9 % 100 mL IVPB       See Hyperspace for full Linked Orders Report.   100 mg 200 mL/hr over 30 Minutes Intravenous Daily 03/01/21 0050 03/06/21 0959   03/01/21 0100  remdesivir 200 mg in sodium chloride 0.9% 250 mL IVPB       See Hyperspace for full Linked Orders Report.   200 mg 580 mL/hr over 30 Minutes Intravenous Once 03/01/21 0050 03/01/21 0217  Family Communication/Anticipated D/C date and plan/Code Status   DVT prophylaxis:      Code Status: Full Code  Family Communication: None Disposition Plan:    Status is: Observation  The patient will require care spanning > 2 midnights and should be moved to inpatient because: IV treatments appropriate due to intensity of illness or inability to take PO  Dispo: The patient is from: Home              Anticipated d/c is to: Home              Patient currently is not medically stable to d/c.   Difficult to place patient No           Subjective:   C/o shortness of breath and constipation.  He requested laxatives for constipation.  He  said he does not feel he is ready to go home today.  Objective:    Vitals:   03/01/21 2021 03/01/21 2328 03/02/21 0518 03/02/21 0700  BP: (!) 153/60 (!) 144/54 134/63 138/69  Pulse: 67 62 (!) 54 67  Resp: 16 18 20 18   Temp: 97.7 F (36.5 C) 98.4 F (36.9 C) (!) 97.4 F (36.3 C) 97.8 F (36.6 C)  TempSrc:   Oral Oral  SpO2: 94% 97% 98% 98%  Weight:      Height:       Orthostatic VS for the past 24 hrs:  BP- Lying Pulse- Lying BP- Sitting Pulse- Sitting BP- Standing at 0 minutes Pulse- Standing at 0 minutes  03/02/21 0946 (P) 120/57 (P) 60 132/48 58 118/51 71    No intake or output data in the 24 hours ending 03/02/21 1000  Filed Weights   02/28/21 2136 03/01/21 0237  Weight: 120.7 kg 113.5 kg    Exam:  GEN: NAD SKIN: Warm and dry EYES: EOMI ENT: MMM CV: RRR PULM: CTA B ABD: soft, obese, NT, +BS CNS: AAO x 3, non focal EXT: Bilateral leg edema is getting better, no tenderness        Data Reviewed:   I have personally reviewed following labs and imaging studies:  Labs: Labs show the following:   Basic Metabolic Panel: Recent Labs  Lab 02/28/21 2144 03/01/21 0248 03/02/21 0500  NA 130* 132* 135  K 4.8 5.4* 4.5  CL 102 101 100  CO2 21* 24 25  GLUCOSE 143* 180* 227*  BUN 65* 61* 73*  CREATININE 1.97* 1.88* 1.88*  CALCIUM 8.5* 8.6* 9.1  MG  --  1.9  1.9 2.1  PHOS  --  4.0 5.2*   GFR Estimated Creatinine Clearance: 33.9 mL/min (A) (by C-G formula based on SCr of 1.88 mg/dL (H)). Liver Function Tests: Recent Labs  Lab 03/01/21 0248 03/02/21 0500  AST 52* 42*  ALT 35 33  ALKPHOS 65 62  BILITOT 0.8 0.7  PROT 7.1 6.9  ALBUMIN 3.6 3.6   No results for input(s): LIPASE, AMYLASE in the last 168 hours. No results for input(s): AMMONIA in the last 168 hours. Coagulation profile Recent Labs  Lab 02/28/21 2144  INR 1.0    CBC: Recent Labs  Lab 02/28/21 2144 03/01/21 0248 03/02/21 0500  WBC 7.3 6.3 6.3  NEUTROABS  --  4.5 3.7  HGB  11.4* 11.1* 11.6*  HCT 33.4* 33.2* 33.4*  MCV 91.0 92.7 90.5  PLT 143* 143* 157   Cardiac Enzymes: Recent Labs  Lab 03/01/21 0248  CKTOTAL 418*   BNP (last 3 results) No results for input(s): PROBNP  in the last 8760 hours. CBG: Recent Labs  Lab 03/01/21 1639 03/01/21 2022 03/02/21 0037 03/02/21 0542 03/02/21 0804  GLUCAP 288* 260* 239* 215* 216*   D-Dimer: Recent Labs    02/28/21 2326 03/02/21 0500  DDIMER 0.69* 0.51*   Hgb A1c: Recent Labs    03/01/21 0248  HGBA1C 6.5*   Lipid Profile: No results for input(s): CHOL, HDL, LDLCALC, TRIG, CHOLHDL, LDLDIRECT in the last 72 hours. Thyroid function studies: Recent Labs    03/01/21 0248  TSH 1.525   Anemia work up: Recent Labs    02/28/21 2326 03/02/21 0500  FERRITIN 655* 699*   Sepsis Labs: Recent Labs  Lab 02/28/21 2144 02/28/21 2326 03/01/21 0248 03/02/21 0500  PROCALCITON  --  <0.10  --   --   WBC 7.3  --  6.3 6.3    Microbiology Recent Results (from the past 240 hour(s))  Resp Panel by RT-PCR (Flu A&B, Covid) Nasopharyngeal Swab     Status: Abnormal   Collection Time: 02/28/21 11:26 PM   Specimen: Nasopharyngeal Swab; Nasopharyngeal(NP) swabs in vial transport medium  Result Value Ref Range Status   SARS Coronavirus 2 by RT PCR POSITIVE (A) NEGATIVE Final    Comment: RESULT CALLED TO, READ BACK BY AND VERIFIED WITH: Dee Givens @0028  on 03/01/21 SKL (NOTE) SARS-CoV-2 target nucleic acids are DETECTED.  The SARS-CoV-2 RNA is generally detectable in upper respiratory specimens during the acute phase of infection. Positive results are indicative of the presence of the identified virus, but do not rule out bacterial infection or co-infection with other pathogens not detected by the test. Clinical correlation with patient history and other diagnostic information is necessary to determine patient infection status. The expected result is Negative.  Fact Sheet for  Patients: EntrepreneurPulse.com.au  Fact Sheet for Healthcare Providers: IncredibleEmployment.be  This test is not yet approved or cleared by the [place] FDA and  has been authorized for detection and/or diagnosis of SARS-CoV-2 by FDA under an Emergency Use Authorization (EUA).  This EUA will remain in effect (meaning this test can be u sed) for the duration of  the COVID-19 declaration under Section 564(b)(1) of the Act, 21 U.S.C. section 360bbb-3(b)(1), unless the authorization is terminated or revoked sooner.     Influenza A by PCR NEGATIVE NEGATIVE Final   Influenza B by PCR NEGATIVE NEGATIVE Final    Comment: (NOTE) The Xpert Xpress SARS-CoV-2/FLU/RSV plus assay is intended as an aid in the diagnosis of influenza from Nasopharyngeal swab specimens and should not be used as a sole basis for treatment. Nasal washings and aspirates are unacceptable for Xpert Xpress SARS-CoV-2/FLU/RSV testing.  Fact Sheet for Patients: EntrepreneurPulse.com.au  Fact Sheet for Healthcare Providers: IncredibleEmployment.be  This test is not yet approved or cleared by the [place] FDA and has been authorized for detection and/or diagnosis of SARS-CoV-2 by FDA under an Emergency Use Authorization (EUA). This EUA will remain in effect (meaning this test can be used) for the duration of the COVID-19 declaration under Section 564(b)(1) of the Act, 21 U.S.C. section 360bbb-3(b)(1), unless the authorization is terminated or revoked.  Performed at [facility], [address] [place], [place] [address]     Procedures and diagnostic studies:  DG Chest 2 View  Result Date: 02/28/2021 CLINICAL DATA:  Shortness of breath EXAM: CHEST - 2 VIEW COMPARISON:  01/12/2015 FINDINGS: Heart is normal size. No confluent airspace opacities or effusions. No acute bony abnormality. IMPRESSION: No active cardiopulmonary  disease. Electronically Signed  By: Rolm Baptise M.D.   On: 02/28/2021 22:07               LOS: 0 days   Evanie Buckle  [facility]   Pager on [email or web address]. If 7PM-7AM, please contact night-coverage at [email or web address]     03/02/2021, 10:00 AM

## 2021-03-02 NOTE — TOC Progression Note (Signed)
Transition of Care [facility]) - Progression Note    Patient Details  Name: Jimmy Butler MRN: [id number] Date of Birth: 08-22-1933  [facility]) CM/SW Accident, RN Phone Number: 03/02/2021, 4:19 PM  Clinical Narrative:   Patient has recommendations to go home with Home Health.  [facility], Nanine Means, have all refused due to insurance not in network.  Inquires out to [place]  and [facility] thus far.  DME will be provided through [facility].         Expected Discharge Plan and Services                                                 Social Determinants of Health (SDOH) Interventions    Readmission Risk Interventions No flowsheet data found.

## 2021-03-02 NOTE — Progress Notes (Signed)
Inpatient Diabetes Program Recommendations  AACE/ADA: New Consensus Statement on Inpatient Glycemic Control   Target Ranges:  Prepandial:   less than 140 mg/dL      Peak postprandial:   less than 180 mg/dL (1-2 hours)      Critically ill patients:  140 - 180 mg/dL   Results for INRI, SOBIESKI (MRN [id number]) as of 03/02/2021 07:28  Ref. Range 03/01/2021 08:12 03/01/2021 11:28 03/01/2021 16:39 03/01/2021 20:22 03/02/2021 00:37 03/02/2021 05:42  Glucose-Capillary Latest Ref Range: 70 - 99 mg/dL 262 (H) 324 (H) 288 (H) 260 (H) 239 (H) 215 (H)    Review of Glycemic Control  Diabetes history: DM2 Outpatient Diabetes medications: Trulicity 8.59 mg Qweek, Amaryl 2 mg daily, Metformin XR 500 mg daily Current orders for Inpatient glycemic control: Novolog 0-9 units Q4H; Solumedrol 60.625 mg Q12H   Inpatient Diabetes Program Recommendations:     Insulin: If steroids are continued, please consider ordering Levemir 15 units Q24H and Novolog 4 units TID with meals for meal coverage if patient eats at least 50% of meals.   Thanks, Barnie Alderman, RN, MSN, CDE Diabetes Coordinator Inpatient Diabetes Program [phone] (Team Pager from 8am to 5pm)

## 2021-03-03 LAB — COMPREHENSIVE METABOLIC PANEL
ALT: 44 U/L (ref 0–44)
AST: 53 U/L — ABNORMAL HIGH (ref 15–41)
Albumin: 3.5 g/dL (ref 3.5–5.0)
Alkaline Phosphatase: 60 U/L (ref 38–126)
Anion gap: 13 (ref 5–15)
BUN: 93 mg/dL — ABNORMAL HIGH (ref 8–23)
CO2: 24 mmol/L (ref 22–32)
Calcium: 9 mg/dL (ref 8.9–10.3)
Chloride: 96 mmol/L — ABNORMAL LOW (ref 98–111)
Creatinine, Ser: 2.2 mg/dL — ABNORMAL HIGH (ref 0.61–1.24)
GFR, Estimated: 28 mL/min — ABNORMAL LOW (ref >60)
Glucose, Bld: 258 mg/dL — ABNORMAL HIGH (ref 70–99)
Potassium: 4.3 mmol/L (ref 3.5–5.1)
Sodium: 133 mmol/L — ABNORMAL LOW (ref 135–145)
Total Bilirubin: 0.5 mg/dL (ref 0.3–1.2)
Total Protein: 6.7 g/dL (ref 6.5–8.1)

## 2021-03-03 LAB — CBC WITH DIFFERENTIAL/PLATELET
Abs Immature Granulocytes: 0.12 10*3/uL — ABNORMAL HIGH (ref 0.00–0.07)
Basophils Absolute: 0 10*3/uL (ref 0.0–0.1)
Basophils Relative: 0 %
Eosinophils Absolute: 0 10*3/uL (ref 0.0–0.5)
Eosinophils Relative: 0 %
HCT: 32.3 % — ABNORMAL LOW (ref 39.0–52.0)
Hemoglobin: 11 g/dL — ABNORMAL LOW (ref 13.0–17.0)
Immature Granulocytes: 1 %
Lymphocytes Relative: 15 %
Lymphs Abs: 1.3 10*3/uL (ref 0.7–4.0)
MCH: 31.4 pg (ref 26.0–34.0)
MCHC: 34.1 g/dL (ref 30.0–36.0)
MCV: 92.3 fL (ref 80.0–100.0)
Monocytes Absolute: 0.8 10*3/uL (ref 0.1–1.0)
Monocytes Relative: 9 %
Neutro Abs: 6.3 10*3/uL (ref 1.7–7.7)
Neutrophils Relative %: 75 %
Platelets: 156 10*3/uL (ref 150–400)
RBC: 3.5 MIL/uL — ABNORMAL LOW (ref 4.22–5.81)
RDW: 13 % (ref 11.5–15.5)
WBC: 8.5 10*3/uL (ref 4.0–10.5)
nRBC: 0 % (ref 0.0–0.2)

## 2021-03-03 LAB — MAGNESIUM: Magnesium: 2.1 mg/dL (ref 1.7–2.4)

## 2021-03-03 LAB — FERRITIN: Ferritin: 711 ng/mL — ABNORMAL HIGH (ref 24–336)

## 2021-03-03 LAB — C-REACTIVE PROTEIN: CRP: 0.6 mg/dL (ref <1.0)

## 2021-03-03 LAB — GLUCOSE, CAPILLARY
Glucose-Capillary: 237 mg/dL — ABNORMAL HIGH (ref 70–99)
Glucose-Capillary: 224 mg/dL — ABNORMAL HIGH (ref 70–99)
Glucose-Capillary: 278 mg/dL — ABNORMAL HIGH (ref 70–99)
Glucose-Capillary: 287 mg/dL — ABNORMAL HIGH (ref 70–99)
Glucose-Capillary: 355 mg/dL — ABNORMAL HIGH (ref 70–99)

## 2021-03-03 LAB — PHOSPHORUS: Phosphorus: 5.4 mg/dL — ABNORMAL HIGH (ref 2.5–4.6)

## 2021-03-03 MED ORDER — INSULIN ASPART 100 UNIT/ML IJ SOLN
7.0000 [IU] | Freq: Three times a day (TID) | INTRAMUSCULAR | Status: DC
  Administered 2021-03-03 – 2021-03-05 (×7): 7 [IU] via SUBCUTANEOUS
  Filled 2021-03-03 (×7): qty 1

## 2021-03-03 MED ORDER — ENOXAPARIN SODIUM 40 MG/0.4ML IJ SOSY
40.0000 mg | PREFILLED_SYRINGE | INTRAMUSCULAR | Status: DC
  Administered 2021-03-04 – 2021-03-05 (×2): 40 mg via SUBCUTANEOUS
  Filled 2021-03-03 (×2): qty 0.4

## 2021-03-03 MED ORDER — GUAIFENESIN ER 600 MG PO TB12
1200.0000 mg | ORAL_TABLET | Freq: Two times a day (BID) | ORAL | Status: DC
  Administered 2021-03-03 – 2021-03-05 (×5): 1200 mg via ORAL
  Filled 2021-03-03 (×5): qty 2

## 2021-03-03 MED ORDER — INSULIN GLARGINE 100 UNIT/ML ~~LOC~~ SOLN
16.0000 [IU] | Freq: Every day | SUBCUTANEOUS | Status: DC
  Administered 2021-03-03 – 2021-03-05 (×3): 16 [IU] via SUBCUTANEOUS
  Filled 2021-03-03 (×4): qty 0.16

## 2021-03-03 NOTE — Progress Notes (Signed)
PROGRESS NOTE    Jimmy Butler  IDP:[id number] DOB: 08-28-1933 DOA: 02/28/2021 PCP: Josetta Huddle, MD   Assessment & Plan:   Active Problems:   Diabetes mellitus (HCC)   GERD (gastroesophageal reflux disease)   Coronary atherosclerosis of native coronary artery   Essential hypertension, benign   Mixed hyperlipidemia   COVID-19 virus infection   COPD with acute exacerbation (HCC)   Hyponatremia   AKI (acute kidney injury) ([place])   CKD (chronic kidney disease), stage III ([place])   Dehydration   COVID-19    COVID-19 infection and suspected COPD exacerbation: Continue bronchodilators, IV remdesivir and IV steroids. Encourage incentive spirometry   CKDIIIb: Cr is trending up from day prior. Avoid nephrotoxic meds  Hyperkalemia: resolved    DM2: well controlled, HbA1c 6.5. Continue on lantus, aspart TID w/ meals & SSI w/ accuchecks   Hypertension: continue on amlodipine, bisoprolol. Hold MAP and/or HR <65   CAD: continue on home dose of bisoprolol, statin    Generalized weakness: PT and OT recommend home health therapy  Hyponatremia: labile. Will continue to monitor   Morbid obesity: BMI 39.1. Complicates overall care and prognosis   DVT prophylaxis: lovenox  Code Status: full  Family Communication:  Disposition Plan: likely d/c back home   Level of care: Med-Surg  Status is: Inpatient  Remains inpatient appropriate because:IV treatments appropriate due to intensity of illness or inability to take PO and Inpatient level of care appropriate due to severity of illness  Dispo: The patient is from: Home              Anticipated d/c is to: Home              Patient currently is not medically stable to d/c.   Difficult to place patient: unclear    Consultants:    Procedures:   Antimicrobials:    Subjective: Pt c/o shortness of breath improved from day prior  Objective: Vitals:   03/02/21 1715 03/02/21 2148 03/02/21 2329 03/03/21 0442  BP: (!) 122/50 138/62  (!) 114/47 134/61  Pulse: (!) 57 62 (!) 59 (!) 54  Resp: 18 20 20 20   Temp: 97.8 F (36.6 C) 98 F (36.7 C) (!) 97.5 F (36.4 C) (!) 97.5 F (36.4 C)  TempSrc:  Oral Oral Oral  SpO2: 95% 97% 99% 100%  Weight:      Height:        Intake/Output Summary (Last 24 hours) at 03/03/2021 0744 Last data filed at 03/02/2021 1848 Gross per 24 hour  Intake 115.08 ml  Output --  Net 115.08 ml   Filed Weights   02/28/21 2136 03/01/21 0237  Weight: 120.7 kg 113.5 kg    Examination:  General exam: Appears calm and comfortable  Respiratory system: diminished breath sounds b/l Cardiovascular system: S1 & S2 +. No rubs, gallops or clicks.  Gastrointestinal system: Abdomen is obese soft and nontender. Normal bowel sounds heard. Central nervous system: Alert and oriented. Moves all extremities  Psychiatry: Judgement and insight appear normal. Mood & affect appropriate.     Data Reviewed: I have personally reviewed following labs and imaging studies  CBC: Recent Labs  Lab 02/28/21 2144 03/01/21 0248 03/02/21 0500 03/03/21 0514  WBC 7.3 6.3 6.3 8.5  NEUTROABS  --  4.5 3.7 6.3  HGB 11.4* 11.1* 11.6* 11.0*  HCT 33.4* 33.2* 33.4* 32.3*  MCV 91.0 92.7 90.5 92.3  PLT 143* 143* 157 443   Basic Metabolic Panel: Recent Labs  Lab 02/28/21  2144 03/01/21 0248 03/02/21 0500 03/03/21 0514  NA 130* 132* 135 133*  K 4.8 5.4* 4.5 4.3  CL 102 101 100 96*  CO2 21* 24 25 24   GLUCOSE 143* 180* 227* 258*  BUN 65* 61* 73* 93*  CREATININE 1.97* 1.88* 1.88* 2.20*  CALCIUM 8.5* 8.6* 9.1 9.0  MG  --  1.9  1.9 2.1 2.1  PHOS  --  4.0 5.2* 5.4*   GFR: Estimated Creatinine Clearance: 29 mL/min (A) (by C-G formula based on SCr of 2.2 mg/dL (H)). Liver Function Tests: Recent Labs  Lab 03/01/21 0248 03/02/21 0500 03/03/21 0514  AST 52* 42* 53*  ALT 35 33 44  ALKPHOS 65 62 60  BILITOT 0.8 0.7 0.5  PROT 7.1 6.9 6.7  ALBUMIN 3.6 3.6 3.5   No results for input(s): LIPASE, AMYLASE in the last  168 hours. No results for input(s): AMMONIA in the last 168 hours. Coagulation Profile: Recent Labs  Lab 02/28/21 2144  INR 1.0   Cardiac Enzymes: Recent Labs  Lab 03/01/21 0248  CKTOTAL 418*   BNP (last 3 results) No results for input(s): PROBNP in the last 8760 hours. HbA1C: Recent Labs    03/01/21 0248  HGBA1C 6.5*   CBG: Recent Labs  Lab 03/02/21 0804 03/02/21 1149 03/02/21 1624 03/02/21 2147 03/03/21 0523  GLUCAP 216* 356* 304* 359* 237*   Lipid Profile: No results for input(s): CHOL, HDL, LDLCALC, TRIG, CHOLHDL, LDLDIRECT in the last 72 hours. Thyroid Function Tests: Recent Labs    03/01/21 0248  TSH 1.525   Anemia Panel: Recent Labs    03/02/21 0500 03/03/21 0514  FERRITIN 699* 711*   Sepsis Labs: Recent Labs  Lab 02/28/21 2326  PROCALCITON <0.10    Recent Results (from the past 240 hour(s))  Resp Panel by RT-PCR (Flu A&B, Covid) Nasopharyngeal Swab     Status: Abnormal   Collection Time: 02/28/21 11:26 PM   Specimen: Nasopharyngeal Swab; Nasopharyngeal(NP) swabs in vial transport medium  Result Value Ref Range Status   SARS Coronavirus 2 by RT PCR POSITIVE (A) NEGATIVE Final    Comment: RESULT CALLED TO, READ BACK BY AND VERIFIED WITH: Dee Givens @0028  on 03/01/21 SKL (NOTE) SARS-CoV-2 target nucleic acids are DETECTED.  The SARS-CoV-2 RNA is generally detectable in upper respiratory specimens during the acute phase of infection. Positive results are indicative of the presence of the identified virus, but do not rule out bacterial infection or co-infection with other pathogens not detected by the test. Clinical correlation with patient history and other diagnostic information is necessary to determine patient infection status. The expected result is Negative.  Fact Sheet for Patients: EntrepreneurPulse.com.au  Fact Sheet for Healthcare Providers: IncredibleEmployment.be  This test is not yet  approved or cleared by the [place] FDA and  has been authorized for detection and/or diagnosis of SARS-CoV-2 by FDA under an Emergency Use Authorization (EUA).  This EUA will remain in effect (meaning this test can be u sed) for the duration of  the COVID-19 declaration under Section 564(b)(1) of the Act, 21 U.S.C. section 360bbb-3(b)(1), unless the authorization is terminated or revoked sooner.     Influenza A by PCR NEGATIVE NEGATIVE Final   Influenza B by PCR NEGATIVE NEGATIVE Final    Comment: (NOTE) The Xpert Xpress SARS-CoV-2/FLU/RSV plus assay is intended as an aid in the diagnosis of influenza from Nasopharyngeal swab specimens and should not be used as a sole basis for treatment. Nasal washings and aspirates are unacceptable for  Xpert Xpress SARS-CoV-2/FLU/RSV testing.  Fact Sheet for Patients: EntrepreneurPulse.com.au  Fact Sheet for Healthcare Providers: IncredibleEmployment.be  This test is not yet approved or cleared by the [place] FDA and has been authorized for detection and/or diagnosis of SARS-CoV-2 by FDA under an Emergency Use Authorization (EUA). This EUA will remain in effect (meaning this test can be used) for the duration of the COVID-19 declaration under Section 564(b)(1) of the Act, 21 U.S.C. section 360bbb-3(b)(1), unless the authorization is terminated or revoked.  Performed at [facility], [address] [place], [place] [address]          Radiology Studies: No results found.      Scheduled Meds:  amLODipine  10 mg Oral Daily   aspirin EC  325 mg Oral Daily   bisoprolol  5 mg Oral Daily   clopidogrel  75 mg Oral Daily   enoxaparin (LOVENOX) injection  0.5 mg/kg Subcutaneous Q24H   furosemide  40 mg Oral BID   insulin aspart  0-9 Units Subcutaneous Q4H   insulin aspart  4 Units Subcutaneous TID WC   insulin glargine  12 Units Subcutaneous Daily   Ipratropium-Albuterol  1  puff Inhalation Q6H   methylPREDNISolone (SOLU-MEDROL) injection  0.5 mg/kg Intravenous Q12H   Followed by   Derrill Memo ON 03/04/2021] predniSONE  50 mg Oral Daily   polyethylene glycol  17 g Oral Daily   rosuvastatin  5 mg Oral Daily   vitamin B-12  1,000 mcg Oral Daily   Continuous Infusions:  remdesivir 100 mg in NS 100 mL Stopped (03/02/21 0932)     LOS: 1 day    Time spent: 31 mins    Wyvonnia Dusky, MD [facility] Pager [phone]  If 7PM-7AM, please contact night-coverage 03/03/2021, 7:44 AM

## 2021-03-03 NOTE — Progress Notes (Signed)
Anticoagulation monitoring(Lovenox):  87 yo male ordered Lovenox 30 mg Q24h    Filed Weights   02/28/21 2136 03/01/21 0237  Weight: 120.7 kg (266 lb 1.5 oz) 113.5 kg (250 lb 3.6 oz)   BMI 41.68    Lab Results  Component Value Date   CREATININE 2.20 (H) 03/03/2021   CREATININE 1.88 (H) 03/02/2021   CREATININE 1.88 (H) 03/01/2021   Estimated Creatinine Clearance: 29 mL/min (A) (by C-G formula based on SCr of 2.2 mg/dL (H)). Hemoglobin & Hematocrit     Component Value Date/Time   HGB 11.0 (L) 03/03/2021 0514   HCT 32.3 (L) 03/03/2021 0459     Per Protocol for Patient with estCrcl < 30 ml/min and BMI > 30, will transition to Lovenox 40 mg Q24h.     Chinita Greenland PharmD Clinical Pharmacist 03/03/2021

## 2021-03-03 NOTE — TOC Progression Note (Signed)
Transition of Care [facility]) - Progression Note    Patient Details  Name: Jimmy Butler MRN: [id number] Date of Birth: [birth year]  Transition of Care [facility]) CM/SW [place], RN Phone Number: 03/03/2021, 3:16 PM  Clinical Narrative:   Recommendation is for patient to go home with home health.  Patient has [employer] and [facility] will get authorization from [employer].           Expected Discharge Plan and Services                                                 Social Determinants of Health (SDOH) Interventions    Readmission Risk Interventions No flowsheet data found.

## 2021-03-03 NOTE — Progress Notes (Signed)
Inpatient Diabetes Program Recommendations  AACE/ADA: New Consensus Statement on Inpatient Glycemic Control   Target Ranges:  Prepandial:   less than 140 mg/dL      Peak postprandial:   less than 180 mg/dL (1-2 hours)      Critically ill patients:  140 - 180 mg/dL   Results for FRANCO, DULEY (MRN [id number]) as of 03/03/2021 08:05  Ref. Range 03/02/2021 08:04 03/02/2021 11:49 03/02/2021 16:24 03/02/2021 21:47 03/03/2021 05:23  Glucose-Capillary Latest Ref Range: 70 - 99 mg/dL 216 (H) 356 (H) 304 (H) 359 (H) 237 (H)    Review of Glycemic Control  Diabetes history: DM2 Outpatient Diabetes medications: Trulicity 1.64 mg Qweek, Amaryl 2 mg daily, Metformin XR 500 mg daily Current orders for Inpatient glycemic control: Lantus 12 units daily, Novolog 4 units TID with meals, Novolog 0-9 units Q4H; Solumedrol 60.625 mg Q12H   Inpatient Diabetes Program Recommendations:     Insulin: If steroids are continued, please consider increasing Lantus to 16 units daily and meal coverage to Novolog 7 units TID with meals if patient eats at least 50% of meals.  Thanks, Barnie Alderman, RN, MSN, CDE Diabetes Coordinator Inpatient Diabetes Program [phone] (Team Pager from 8am to 5pm)

## 2021-03-03 NOTE — Progress Notes (Signed)
Occupational Therapy Treatment Patient Details Name: Jimmy Butler MRN: [id number] DOB: 05/22/1933 Today's Date: 03/03/2021    History of present illness Pt is an 87 y.o. male presenting to hospital 6/12 with gradually worsening SOB over the last week; body aches, decreased appetite, and weakness.  Pt admitted with COVID-19 infection, suspected COPD exacerbation, Mild AKI on CKD stage IIIb, hyperkalemia, peripheral edema, and generalized weakness.  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   OT comments  Mr. Cedillos seen for OT treatment on this date. Upon arrival to room, pt awake/alert, seated in room recliner, agreeable to OT tx session. Pt eager to participate in functional mobility, and requires some cueing for safety as he is mildly impulsive with mobility this date. Pt performs functional mobility in room with supervision and cueing for safety. Pt able to transfer to [facility] with supervision for safety. Does not perform toileting at time of OT evaluation. Education on energy conservation strategies, compensatory strategies for managment of visual impairment, and falls prevention strategies provided t/o session. Pt return verbalizes understanding of education provided. Pt vitals monitored t/o session. HR remains WFL. SO2 drops to 86% at lowest during session during functional activity. Pt requires cueing to implement ECS and take therapeutic rest breaks t/o session. Pt making good progress toward goals and continues to benefit from skilled OT services to maximize return to PLOF and minimize risk of future falls, injury, caregiver burden, and readmission. Will continue to follow POC. Discharge recommendation remains appropriate.    Follow Up Recommendations  Home health OT;Other (comment)    Equipment Recommendations  3 in 1 bedside commode;Other (comment)    Recommendations for Other Services      Precautions /  Restrictions Precautions Precautions: Fall Precaution Comments: air/contact 2/2 Covid Restrictions Weight Bearing Restrictions: No       Mobility Bed Mobility               General bed mobility comments: Deferred (pt in recliner beginning/end of session)    Transfers Overall transfer level: Needs assistance Equipment used: Rolling walker (2 wheeled) Transfers: Sit to/from [facility] Sit to Stand: Supervision Stand pivot transfers: Supervision            Balance Overall balance assessment: Needs assistance Sitting-balance support: No upper extremity supported;Feet supported Sitting balance-Leahy Scale: Good Sitting balance - Comments: steady sitting reaching outside BOS   Standing balance support: Bilateral upper extremity supported;During functional activity Standing balance-Leahy Scale: Fair                             ADL either performed or assessed with clinical judgement   ADL Overall ADL's : Needs assistance/impaired                         Toilet Transfer: Supervision/safety;RW;BSC;Ambulation           Functional mobility during ADLs: Supervision/safety;Set up;Cueing for safety;Rolling walker General ADL Comments: Pt performs functional mobility in room with supervision and cueing for safety. Pt able to transfer to [facility] with supervision for safety. Does not perform toileting at time of OT evaluation. Education on energy conservation strategies, compensatory strategies for managment of visual impairment, and falls prevention strategies provided t/o session. Pt return verbalizes understanding of education provided.     Vision Baseline Vision/History: Wears glasses (Pt reports significant hx of progressive visual deficits, states  corrective lenses no longer work for him. He is unable to see written materials.) Patient Visual Report: No change from baseline     Perception     Praxis      Cognition  Arousal/Alertness: Awake/alert Behavior During Therapy: WFL for tasks assessed/performed Overall Cognitive Status: Within Functional Limits for tasks assessed                                          Exercises Other Exercises Other Exercises: Reinforcement of past education on energy conservation strategies to support ADL/IADL participation while minimizing over exertion and SOB.   Shoulder Instructions       General Comments Pt vitals monitored t/o session. HR remains WFL. SO2 drops to 86% at lowest during session during functional activity. Pt requires cueing to implement ECS and take therapeutic rest breaks t/o session.    Pertinent Vitals/ Pain       Pain Assessment: No/denies pain  Home Living                                          Prior Functioning/Environment              Frequency  Min 2X/week        Progress Toward Goals  OT Goals(current goals can now be found in the care plan section)  Progress towards OT goals: Progressing toward goals  Acute Rehab OT Goals Patient Stated Goal: to go home OT Goal Formulation: With patient Time For Goal Achievement: 03/15/21 Potential to Achieve Goals: Good  Plan Discharge plan remains appropriate;Frequency remains appropriate    Co-evaluation                 AM-PAC OT "6 Clicks" Daily Activity     Outcome Measure   Help from another person eating meals?: None Help from another person taking care of personal grooming?: None Help from another person toileting, which includes using toliet, bedpan, or urinal?: A Lot Help from another person bathing (including washing, rinsing, drying)?: A Little Help from another person to put on and taking off regular upper body clothing?: None Help from another person to put on and taking off regular lower body clothing?: A Lot 6 Click Score: 19    End of Session Equipment Utilized During Treatment: Gait belt;Rolling walker  OT Visit  Diagnosis: Other abnormalities of gait and mobility (R26.89);Muscle weakness (generalized) (M62.81)   Activity Tolerance Patient tolerated treatment well   Patient Left in chair;with call bell/phone within reach;with chair alarm set   Nurse Communication          Time: 1025-8527 OT Time Calculation (min): 23 min  Charges: OT General Charges $OT Visit: 1 Visit OT Treatments $Self Care/Home Management : 8-22 mins $Therapeutic Activity: 8-22 mins  Shara Blazing, M.S., OTR/L Ascom: [phone] 03/03/21, 4:09 PM

## 2021-03-04 LAB — GLUCOSE, CAPILLARY
Glucose-Capillary: 273 mg/dL — ABNORMAL HIGH (ref 70–99)
Glucose-Capillary: 294 mg/dL — ABNORMAL HIGH (ref 70–99)
Glucose-Capillary: 312 mg/dL — ABNORMAL HIGH (ref 70–99)
Glucose-Capillary: 341 mg/dL — ABNORMAL HIGH (ref 70–99)
Glucose-Capillary: 351 mg/dL — ABNORMAL HIGH (ref 70–99)
Glucose-Capillary: 405 mg/dL — ABNORMAL HIGH (ref 70–99)
Glucose-Capillary: 407 mg/dL — ABNORMAL HIGH (ref 70–99)

## 2021-03-04 LAB — CBC WITH DIFFERENTIAL/PLATELET
Abs Immature Granulocytes: 0.16 10*3/uL — ABNORMAL HIGH (ref 0.00–0.07)
Basophils Absolute: 0 10*3/uL (ref 0.0–0.1)
Basophils Relative: 0 %
Eosinophils Absolute: 0 10*3/uL (ref 0.0–0.5)
Eosinophils Relative: 0 %
HCT: 32.8 % — ABNORMAL LOW (ref 39.0–52.0)
Hemoglobin: 11.4 g/dL — ABNORMAL LOW (ref 13.0–17.0)
Immature Granulocytes: 2 %
Lymphocytes Relative: 13 %
Lymphs Abs: 1.1 10*3/uL (ref 0.7–4.0)
MCH: 31.2 pg (ref 26.0–34.0)
MCHC: 34.8 g/dL (ref 30.0–36.0)
MCV: 89.9 fL (ref 80.0–100.0)
Monocytes Absolute: 0.6 10*3/uL (ref 0.1–1.0)
Monocytes Relative: 7 %
Neutro Abs: 6.7 10*3/uL (ref 1.7–7.7)
Neutrophils Relative %: 78 %
Platelets: 178 10*3/uL (ref 150–400)
RBC: 3.65 MIL/uL — ABNORMAL LOW (ref 4.22–5.81)
RDW: 12.7 % (ref 11.5–15.5)
WBC: 8.6 10*3/uL (ref 4.0–10.5)
nRBC: 0 % (ref 0.0–0.2)

## 2021-03-04 LAB — COMPREHENSIVE METABOLIC PANEL
ALT: 46 U/L — ABNORMAL HIGH (ref 0–44)
AST: 38 U/L (ref 15–41)
Albumin: 3.5 g/dL (ref 3.5–5.0)
Alkaline Phosphatase: 60 U/L (ref 38–126)
Anion gap: 10 (ref 5–15)
BUN: 105 mg/dL — ABNORMAL HIGH (ref 8–23)
CO2: 25 mmol/L (ref 22–32)
Calcium: 8.9 mg/dL (ref 8.9–10.3)
Chloride: 97 mmol/L — ABNORMAL LOW (ref 98–111)
Creatinine, Ser: 2.29 mg/dL — ABNORMAL HIGH (ref 0.61–1.24)
GFR, Estimated: 27 mL/min — ABNORMAL LOW (ref >60)
Glucose, Bld: 303 mg/dL — ABNORMAL HIGH (ref 70–99)
Potassium: 4.5 mmol/L (ref 3.5–5.1)
Sodium: 132 mmol/L — ABNORMAL LOW (ref 135–145)
Total Bilirubin: 0.7 mg/dL (ref 0.3–1.2)
Total Protein: 6.7 g/dL (ref 6.5–8.1)

## 2021-03-04 LAB — C-REACTIVE PROTEIN: CRP: 0.8 mg/dL (ref <1.0)

## 2021-03-04 LAB — PHOSPHORUS: Phosphorus: 5.7 mg/dL — ABNORMAL HIGH (ref 2.5–4.6)

## 2021-03-04 LAB — FERRITIN: Ferritin: 689 ng/mL — ABNORMAL HIGH (ref 24–336)

## 2021-03-04 LAB — MAGNESIUM: Magnesium: 2.3 mg/dL (ref 1.7–2.4)

## 2021-03-04 MED ORDER — BENZONATATE 100 MG PO CAPS
200.0000 mg | ORAL_CAPSULE | Freq: Three times a day (TID) | ORAL | Status: DC | PRN
  Administered 2021-03-04 – 2021-03-05 (×3): 200 mg via ORAL
  Filled 2021-03-04 (×3): qty 2

## 2021-03-04 MED ORDER — INSULIN ASPART 100 UNIT/ML IJ SOLN
0.0000 [IU] | Freq: Three times a day (TID) | INTRAMUSCULAR | Status: DC
  Administered 2021-03-04: 22:00:00 20 [IU] via SUBCUTANEOUS
  Administered 2021-03-05 (×2): 7 [IU] via SUBCUTANEOUS
  Filled 2021-03-04 (×3): qty 1

## 2021-03-04 NOTE — Progress Notes (Signed)
CBG 405 then 407 on recheck. Notified Mansy, MD. New sliding scale orders placed, orders to give 20 units of insulin lispro per sliding scale and recheck in 1 hour. Awaiting pharmacy verification.

## 2021-03-04 NOTE — Progress Notes (Addendum)
PROGRESS NOTE    Jimmy Butler  QGB:[id number] DOB: 1932/10/11 DOA: 02/28/2021 PCP: Josetta Huddle, MD   Assessment & Plan:   Active Problems:   Diabetes mellitus (HCC)   GERD (gastroesophageal reflux disease)   Coronary atherosclerosis of native coronary artery   Essential hypertension, benign   Mixed hyperlipidemia   COVID-19 virus infection   COPD with acute exacerbation (HCC)   Hyponatremia   AKI (acute kidney injury) ([place])   CKD (chronic kidney disease), stage III ([place])   Dehydration   COVID-19    COVID-19 infection and suspected COPD exacerbation: continue on IV remdesivir, steroids & bronchodilators. Encourage incentive spirometry & flutter valve  CKDIIIb: Cr continues to trend up. Will hold lasix today.   Hyperkalemia: resolved    DM2: HbA1c 6.5, well controlled. Continue on lantus, aspart TID w/ meals & SSI w/ accuchecks  Hypertension: continue on bisoprolol, amlodipine. Hold for MAP and/or HR <65   CAD: continue on home dose of bisoprolol, statin    Generalized weakness: PT/OT recs HH.   Hyponatremia: labile. Will continue to monitor   Morbid obesity: BMI 39.1. Complicates overall care & prognosis   DVT prophylaxis: lovenox  Code Status: full  Family Communication:  Disposition Plan: likely d/c back home   Level of care: Med-Surg  Status is: Inpatient  Remains inpatient appropriate because:IV treatments appropriate due to intensity of illness or inability to take PO and Inpatient level of care appropriate due to severity of illness, Cr is trending up daily  Dispo: The patient is from: Home              Anticipated d/c is to: Home w/ home health               Patient currently is not medically stable to d/c.   Difficult to place patient: unclear    Consultants:    Procedures:   Antimicrobials:    Subjective: Pt c/o shortness of breath improved from day prior  Objective: Vitals:   03/03/21 1202 03/03/21 1943 03/04/21 0029 03/04/21 0409   BP: 104/68 (!) 125/52 118/61 114/62  Pulse: (!) 56 (!) 57 (!) 56 60  Resp: 18 14 14 20   Temp: 97.6 F (36.4 C) 98 F (36.7 C) 98.4 F (36.9 C) (!) 97.4 F (36.3 C)  TempSrc: Oral Oral  Oral  SpO2: 100% 99% 100% 100%  Weight:      Height:        Intake/Output Summary (Last 24 hours) at 03/04/2021 0730 Last data filed at 03/03/2021 1811 Gross per 24 hour  Intake 84.92 ml  Output --  Net 84.92 ml   Filed Weights   02/28/21 2136 03/01/21 0237  Weight: 120.7 kg 113.5 kg    Examination:  General exam: Appears calm and comfortable  Respiratory system: diminished breath sounds b/l Cardiovascular system: S1 & S2 +. No rubs, gallops or clicks.  Gastrointestinal system: Abdomen is obese soft and nontender. Normal bowel sounds heard. Central nervous system: Alert and oriented. Moves all extremities  Psychiatry: Judgement and insight appear normal. Mood & affect appropriate.     Data Reviewed: I have personally reviewed following labs and imaging studies  CBC: Recent Labs  Lab 02/28/21 2144 03/01/21 0248 03/02/21 0500 03/03/21 0514 03/04/21 0548  WBC 7.3 6.3 6.3 8.5 8.6  NEUTROABS  --  4.5 3.7 6.3 6.7  HGB 11.4* 11.1* 11.6* 11.0* 11.4*  HCT 33.4* 33.2* 33.4* 32.3* 32.8*  MCV 91.0 92.7 90.5 92.3 89.9  PLT 143*  143* 157 156 233   Basic Metabolic Panel: Recent Labs  Lab 02/28/21 2144 03/01/21 0248 03/02/21 0500 03/03/21 0514  NA 130* 132* 135 133*  K 4.8 5.4* 4.5 4.3  CL 102 101 100 96*  CO2 21* 24 25 24   GLUCOSE 143* 180* 227* 258*  BUN 65* 61* 73* 93*  CREATININE 1.97* 1.88* 1.88* 2.20*  CALCIUM 8.5* 8.6* 9.1 9.0  MG  --  1.9  1.9 2.1 2.1  PHOS  --  4.0 5.2* 5.4*   GFR: Estimated Creatinine Clearance: 29 mL/min (A) (by C-G formula based on SCr of 2.2 mg/dL (H)). Liver Function Tests: Recent Labs  Lab 03/01/21 0248 03/02/21 0500 03/03/21 0514  AST 52* 42* 53*  ALT 35 33 44  ALKPHOS 65 62 60  BILITOT 0.8 0.7 0.5  PROT 7.1 6.9 6.7  ALBUMIN 3.6 3.6  3.5   No results for input(s): LIPASE, AMYLASE in the last 168 hours. No results for input(s): AMMONIA in the last 168 hours. Coagulation Profile: Recent Labs  Lab 02/28/21 2144  INR 1.0   Cardiac Enzymes: Recent Labs  Lab 03/01/21 0248  CKTOTAL 418*   BNP (last 3 results) No results for input(s): PROBNP in the last 8760 hours. HbA1C: No results for input(s): HGBA1C in the last 72 hours.  CBG: Recent Labs  Lab 03/03/21 0841 03/03/21 1204 03/03/21 1743 03/03/21 1943 03/04/21 0406  GLUCAP 224* 278* 287* 355* 341*   Lipid Profile: No results for input(s): CHOL, HDL, LDLCALC, TRIG, CHOLHDL, LDLDIRECT in the last 72 hours. Thyroid Function Tests: No results for input(s): TSH, T4TOTAL, FREET4, T3FREE, THYROIDAB in the last 72 hours.  Anemia Panel: Recent Labs    03/02/21 0500 03/03/21 0514  FERRITIN 699* 711*   Sepsis Labs: Recent Labs  Lab 02/28/21 2326  PROCALCITON <0.10    Recent Results (from the past 240 hour(s))  Resp Panel by RT-PCR (Flu A&B, Covid) Nasopharyngeal Swab     Status: Abnormal   Collection Time: 02/28/21 11:26 PM   Specimen: Nasopharyngeal Swab; Nasopharyngeal(NP) swabs in vial transport medium  Result Value Ref Range Status   SARS Coronavirus 2 by RT PCR POSITIVE (A) NEGATIVE Final    Comment: RESULT CALLED TO, READ BACK BY AND VERIFIED WITH: Dee Givens @0028  on 03/01/21 SKL (NOTE) SARS-CoV-2 target nucleic acids are DETECTED.  The SARS-CoV-2 RNA is generally detectable in upper respiratory specimens during the acute phase of infection. Positive results are indicative of the presence of the identified virus, but do not rule out bacterial infection or co-infection with other pathogens not detected by the test. Clinical correlation with patient history and other diagnostic information is necessary to determine patient infection status. The expected result is Negative.  Fact Sheet for  Patients: EntrepreneurPulse.com.au  Fact Sheet for Healthcare Providers: IncredibleEmployment.be  This test is not yet approved or cleared by the [place] FDA and  has been authorized for detection and/or diagnosis of SARS-CoV-2 by FDA under an Emergency Use Authorization (EUA).  This EUA will remain in effect (meaning this test can be u sed) for the duration of  the COVID-19 declaration under Section 564(b)(1) of the Act, 21 U.S.C. section 360bbb-3(b)(1), unless the authorization is terminated or revoked sooner.     Influenza A by PCR NEGATIVE NEGATIVE Final   Influenza B by PCR NEGATIVE NEGATIVE Final    Comment: (NOTE) The Xpert Xpress SARS-CoV-2/FLU/RSV plus assay is intended as an aid in the diagnosis of influenza from Nasopharyngeal swab specimens and should  not be used as a sole basis for treatment. Nasal washings and aspirates are unacceptable for Xpert Xpress SARS-CoV-2/FLU/RSV testing.  Fact Sheet for Patients: EntrepreneurPulse.com.au  Fact Sheet for Healthcare Providers: IncredibleEmployment.be  This test is not yet approved or cleared by the [place] FDA and has been authorized for detection and/or diagnosis of SARS-CoV-2 by FDA under an Emergency Use Authorization (EUA). This EUA will remain in effect (meaning this test can be used) for the duration of the COVID-19 declaration under Section 564(b)(1) of the Act, 21 U.S.C. section 360bbb-3(b)(1), unless the authorization is terminated or revoked.  Performed at [facility], [address] [place], [place] [address]          Radiology Studies: No results found.      Scheduled Meds:  amLODipine  10 mg Oral Daily   aspirin EC  325 mg Oral Daily   bisoprolol  5 mg Oral Daily   clopidogrel  75 mg Oral Daily   enoxaparin (LOVENOX) injection  40 mg Subcutaneous Q24H   furosemide  40 mg Oral BID   guaiFENesin   1,200 mg Oral BID   insulin aspart  0-9 Units Subcutaneous Q4H   insulin aspart  7 Units Subcutaneous TID WC   insulin glargine  16 Units Subcutaneous Daily   Ipratropium-Albuterol  1 puff Inhalation Q6H   polyethylene glycol  17 g Oral Daily   predniSONE  50 mg Oral Daily   rosuvastatin  5 mg Oral Daily   vitamin B-12  1,000 mcg Oral Daily   Continuous Infusions:  remdesivir 100 mg in NS 100 mL Stopped (03/03/21 1008)     LOS: 2 days    Time spent: 31 mins    Wyvonnia Dusky, MD [facility] Pager [phone]  If 7PM-7AM, please contact night-coverage 03/04/2021, 7:30 AM

## 2021-03-05 LAB — CBC WITH DIFFERENTIAL/PLATELET
Abs Immature Granulocytes: 0.16 10*3/uL — ABNORMAL HIGH (ref 0.00–0.07)
Basophils Absolute: 0 10*3/uL (ref 0.0–0.1)
Basophils Relative: 0 %
Eosinophils Absolute: 0 10*3/uL (ref 0.0–0.5)
Eosinophils Relative: 0 %
HCT: 34.6 % — ABNORMAL LOW (ref 39.0–52.0)
Hemoglobin: 11.7 g/dL — ABNORMAL LOW (ref 13.0–17.0)
Immature Granulocytes: 2 %
Lymphocytes Relative: 14 %
Lymphs Abs: 1.5 10*3/uL (ref 0.7–4.0)
MCH: 30.8 pg (ref 26.0–34.0)
MCHC: 33.8 g/dL (ref 30.0–36.0)
MCV: 91.1 fL (ref 80.0–100.0)
Monocytes Absolute: 1.4 10*3/uL — ABNORMAL HIGH (ref 0.1–1.0)
Monocytes Relative: 14 %
Neutro Abs: 7.2 10*3/uL (ref 1.7–7.7)
Neutrophils Relative %: 70 %
Platelets: 176 10*3/uL (ref 150–400)
RBC: 3.8 MIL/uL — ABNORMAL LOW (ref 4.22–5.81)
RDW: 12.7 % (ref 11.5–15.5)
WBC: 10.3 10*3/uL (ref 4.0–10.5)
nRBC: 0 % (ref 0.0–0.2)

## 2021-03-05 LAB — COMPREHENSIVE METABOLIC PANEL
ALT: 48 U/L — ABNORMAL HIGH (ref 0–44)
AST: 33 U/L (ref 15–41)
Albumin: 3.6 g/dL (ref 3.5–5.0)
Alkaline Phosphatase: 59 U/L (ref 38–126)
Anion gap: 9 (ref 5–15)
BUN: 109 mg/dL — ABNORMAL HIGH (ref 8–23)
CO2: 24 mmol/L (ref 22–32)
Calcium: 8.9 mg/dL (ref 8.9–10.3)
Chloride: 98 mmol/L (ref 98–111)
Creatinine, Ser: 2.06 mg/dL — ABNORMAL HIGH (ref 0.61–1.24)
GFR, Estimated: 31 mL/min — ABNORMAL LOW (ref >60)
Glucose, Bld: 286 mg/dL — ABNORMAL HIGH (ref 70–99)
Potassium: 4.5 mmol/L (ref 3.5–5.1)
Sodium: 131 mmol/L — ABNORMAL LOW (ref 135–145)
Total Bilirubin: 0.7 mg/dL (ref 0.3–1.2)
Total Protein: 6.6 g/dL (ref 6.5–8.1)

## 2021-03-05 LAB — GLUCOSE, CAPILLARY
Glucose-Capillary: 244 mg/dL — ABNORMAL HIGH (ref 70–99)
Glucose-Capillary: 222 mg/dL — ABNORMAL HIGH (ref 70–99)
Glucose-Capillary: 246 mg/dL — ABNORMAL HIGH (ref 70–99)

## 2021-03-05 LAB — MAGNESIUM: Magnesium: 2.2 mg/dL (ref 1.7–2.4)

## 2021-03-05 LAB — C-REACTIVE PROTEIN: CRP: 1 mg/dL — ABNORMAL HIGH (ref <1.0)

## 2021-03-05 LAB — PHOSPHORUS: Phosphorus: 4.9 mg/dL — ABNORMAL HIGH (ref 2.5–4.6)

## 2021-03-05 LAB — FERRITIN: Ferritin: 755 ng/mL — ABNORMAL HIGH (ref 24–336)

## 2021-03-05 MED ORDER — PREDNISONE 20 MG PO TABS: 40.0000 mg | ORAL_TABLET | Freq: Every day | ORAL | 0 refills | Status: AC

## 2021-03-05 MED ORDER — GUAIFENESIN ER 600 MG PO TB12: 1200.0000 mg | ORAL_TABLET | Freq: Two times a day (BID) | ORAL | 0 refills | Status: AC

## 2021-03-05 NOTE — Care Management Important Message (Signed)
Important Message  Patient Details  Name: Jimmy Butler MRN: [id number] Date of Birth: Feb 26, 1933   Medicare Important Message Given:  Yes  Patient is in an isolation room so I called and talked with him by phone [phone]) and reviewed the Important Message from Medicare.  He said he was in agreement with the discharge today but asked if I would call his daughter, Bernardo Heater and review it with her too.  I called his Ms. Friddle at [phone] and reviewed the form with her as well. She was in agreement with the discharge plan. I thanked them both for their time.  Juliann Pulse A Huxley Vanwagoner 03/05/2021, 12:44 PM

## 2021-03-05 NOTE — Discharge Summary (Signed)
Physician Discharge Summary  Jimmy Butler ZOX:[id number] DOB: [birth year] DOA: 02/28/2021  PCP: Josetta Huddle, MD  Admit date: 02/28/2021 Discharge date: 03/05/2021  Admitted From: home  Disposition: home w/ home health   Recommendations for Outpatient Follow-up:  Follow up with PCP in 1-2 weeks   Home Health: yes Equipment/Devices:  Discharge Condition: stable  CODE STATUS: full  Diet recommendation: Heart Healthy / Carb Modified   Brief/Interim Summary: HPI was taken from Dr. Roel Cluck: Jimmy Butler is a 87 y.o. male with medical history significant of DM2, HTN, CKD stage 3b, CAD, obesity     Presented with   1 day hx of SOB  Tested yesterday for COVID and was negative but then developed a cough somewhat productive. Symptoms been going on for about a week.  He has chronic shortness of breath but is gotten worse.  Has had decreased appetite not eating and drinking as he supposed to.  He is not vaccinated for COVID No associated fevers or chills no shortness throat no nausea vomiting or diarrhea Former smoker not for the past 30 years Infectious risk factors:  Reports shortness of breath, dry cough,         Has  NOt been vaccinated against COVID      Initial COVID TEST   POSITIVE,    Hospital course from Dr. Jimmye Norman 6/15-6/17/22: Pt was found to have COVID19 infection and likely COPD exacerbation as well. Pt was treated w/ IV remdesivir, steroids, bronchodilators, incentive spirometry and flutter valve. Pt did not require supplemental oxygen. PT/OT evaluated the pt and recommended home health. Home health was set up by CM prior to d/c. For more information, please see previous progress notes.   Discharge Diagnoses:  Active Problems:   Diabetes mellitus (HCC)   GERD (gastroesophageal reflux disease)   Coronary atherosclerosis of native coronary artery   Essential hypertension, benign   Mixed hyperlipidemia   COVID-19 virus infection   COPD with acute exacerbation  (HCC)   Hyponatremia   AKI (acute kidney injury) ([place])   CKD (chronic kidney disease), stage III ([place])   Dehydration   COVID-19  COVID-19 infection and suspected COPD exacerbation: continue on IV remdesivir, steroids & bronchodilators. Encourage incentive spirometry & flutter valve  CKDIIIb: Cr is trending down today.    Hyperkalemia: resolved   DM2: HbA1c 6.5, well controlled. Continue on lantus, aspart TID w/ meals & SSI w/ accuchecks  Hypertension: continue on BB, CCB. Hold for MAP and/or HR <65   CAD: continue on statin, BB    Generalized weakness: PT/OT recs HH.    Hyponatremia: labile. Will continue to monitor    Morbid obesity: BMI 39.1. Complicates overall care & prognosis     Discharge Instructions  Discharge Instructions     Diet - low sodium heart healthy   Complete by: As directed    Diet Carb Modified   Complete by: As directed    Discharge instructions   Complete by: As directed    F/u w/ PCP in 1-2 weeks. Hold telmisartan, amlodipine, carvedilol, chlorthalidone at home if systolic blood pressure is less than 115. Hold amlodipine & carvedilol if heart rate is less than 65   Increase activity slowly   Complete by: As directed       Allergies as of 03/05/2021   No Known Allergies      Medication List     STOP taking these medications    amoxicillin-clavulanate 875-125 MG tablet Commonly known as: AUGMENTIN   azithromycin  250 MG tablet Commonly known as: ZITHROMAX   Molnupiravir 200 MG Caps       TAKE these medications    amLODipine 10 MG tablet Commonly known as: NORVASC Take 1 tablet by mouth daily.   aspirin 81 MG EC tablet Take 81 mg by mouth daily.   atorvastatin 40 MG tablet Commonly known as: LIPITOR Take 1 tablet by mouth daily.   benzonatate 100 MG capsule Commonly known as: TESSALON Take 100 mg by mouth 3 (three) times daily as needed for cough.   carvedilol 6.25 MG tablet Commonly known as: COREG Take 6.25 mg by  mouth 2 (two) times daily.   chlorthalidone 25 MG tablet Commonly known as: HYGROTON Take 25 mg by mouth daily.   glimepiride 2 MG tablet Commonly known as: AMARYL Take 2 mg by mouth daily.   guaiFENesin 600 MG 12 hr tablet Commonly known as: MUCINEX Take 2 tablets (1,200 mg total) by mouth 2 (two) times daily for 7 days.   metFORMIN 500 MG 24 hr tablet Commonly known as: GLUCOPHAGE-XR Take 500 mg by mouth daily.   predniSONE 20 MG tablet Commonly known as: DELTASONE Take 2 tablets (40 mg total) by mouth daily for 5 days.   telmisartan 80 MG tablet Commonly known as: MICARDIS Take 80 mg by mouth daily.   Trulicity 3.87 FI/4.3PI Sopn Generic drug: Dulaglutide Inject 0.75 mg into the skin once a week.   vitamin B-12 1000 MCG tablet Commonly known as: CYANOCOBALAMIN Take 1,000 mcg by mouth daily.               Durable Medical Equipment  (From admission, onward)           Start     Ordered   03/05/21 0855  For home use only DME 3 n 1  Once        03/05/21 0855   03/05/21 0855  For home use only DME Walker rolling  Once       Question Answer Comment  Walker: With 5 Inch Wheels   Patient needs a walker to treat with the following condition Weakness generalized      03/05/21 0855   03/03/21 1525  For home use only DME Walker rolling  Once       Question Answer Comment  Walker: With [employer] Wheels   Patient needs a walker to treat with the following condition Unstable gait      03/03/21 1525   03/03/21 1525  For home use only DME 3 n 1  Once        03/03/21 1525            No Known Allergies  Consultations:    Procedures/Studies: DG Chest 2 View  Result Date: 02/28/2021 CLINICAL DATA:  Shortness of breath EXAM: CHEST - 2 VIEW COMPARISON:  01/12/2015 FINDINGS: Heart is normal size. No confluent airspace opacities or effusions. No acute bony abnormality. IMPRESSION: No active cardiopulmonary disease. Electronically Signed   By: Rolm Baptise M.D.    On: 02/28/2021 22:07      Subjective: Pt c/o intermittent productive cough    Discharge Exam: Vitals:   03/05/21 0736 03/05/21 1153  BP: 125/65 (!) 126/55  Pulse: (!) 54 (!) 58  Resp: 18 18  Temp: (!) 97.5 F (36.4 C) 97.6 F (36.4 C)  SpO2: 95% 94%   Vitals:   03/04/21 2342 03/05/21 0510 03/05/21 0736 03/05/21 1153  BP: 122/69 (!) 121/51 125/65 (!) 126/55  Pulse: 64 (!)  51 (!) 54 (!) 58  Resp: 17 18 18 18   Temp: (!) 97.4 F (36.3 C) (!) 97.5 F (36.4 C) (!) 97.5 F (36.4 C) 97.6 F (36.4 C)  TempSrc: Axillary Oral Oral Oral  SpO2: 97% 96% 95% 94%  Weight:      Height:        General: Pt is alert, awake, not in acute distress Cardiovascular: S1/S2 +, no rubs, no gallops Respiratory: diminished breath sounds b/l  Abdominal: Soft, NT, obese, bowel sounds + Extremities: no cyanosis    The results of significant diagnostics from this hospitalization (including imaging, microbiology, ancillary and laboratory) are listed below for reference.     Microbiology: Recent Results (from the past 240 hour(s))  Resp Panel by RT-PCR (Flu A&B, Covid) Nasopharyngeal Swab     Status: Abnormal   Collection Time: 02/28/21 11:26 PM   Specimen: Nasopharyngeal Swab; Nasopharyngeal(NP) swabs in vial transport medium  Result Value Ref Range Status   SARS Coronavirus 2 by RT PCR POSITIVE (A) NEGATIVE Final    Comment: RESULT CALLED TO, READ BACK BY AND VERIFIED WITH: Dee Givens @0028  on 03/01/21 SKL (NOTE) SARS-CoV-2 target nucleic acids are DETECTED.  The SARS-CoV-2 RNA is generally detectable in upper respiratory specimens during the acute phase of infection. Positive results are indicative of the presence of the identified virus, but do not rule out bacterial infection or co-infection with other pathogens not detected by the test. Clinical correlation with patient history and other diagnostic information is necessary to determine patient infection status. The expected result is  Negative.  Fact Sheet for Patients: EntrepreneurPulse.com.au  Fact Sheet for Healthcare Providers: IncredibleEmployment.be  This test is not yet approved or cleared by the [place] FDA and  has been authorized for detection and/or diagnosis of SARS-CoV-2 by FDA under an Emergency Use Authorization (EUA).  This EUA will remain in effect (meaning this test can be u sed) for the duration of  the COVID-19 declaration under Section 564(b)(1) of the Act, 21 U.S.C. section 360bbb-3(b)(1), unless the authorization is terminated or revoked sooner.     Influenza A by PCR NEGATIVE NEGATIVE Final   Influenza B by PCR NEGATIVE NEGATIVE Final    Comment: (NOTE) The Xpert Xpress SARS-CoV-2/FLU/RSV plus assay is intended as an aid in the diagnosis of influenza from Nasopharyngeal swab specimens and should not be used as a sole basis for treatment. Nasal washings and aspirates are unacceptable for Xpert Xpress SARS-CoV-2/FLU/RSV testing.  Fact Sheet for Patients: EntrepreneurPulse.com.au  Fact Sheet for Healthcare Providers: IncredibleEmployment.be  This test is not yet approved or cleared by the [place] FDA and has been authorized for detection and/or diagnosis of SARS-CoV-2 by FDA under an Emergency Use Authorization (EUA). This EUA will remain in effect (meaning this test can be used) for the duration of the COVID-19 declaration under Section 564(b)(1) of the Act, 21 U.S.C. section 360bbb-3(b)(1), unless the authorization is terminated or revoked.  Performed at [facility], [address] [place], [place] [address]      Labs: BNP (last 3 results) Recent Labs    02/28/21 2144  BNP 702.6*   Basic Metabolic Panel: Recent Labs  Lab 03/01/21 0248 03/02/21 0500 03/03/21 0514 03/04/21 0548 03/05/21 0428  NA 132* 135 133* 132* 131*  K 5.4* 4.5 4.3 4.5 4.5  CL 101 100 96* 97* 98  CO2  24 25 24 25 24   GLUCOSE 180* 227* 258* 303* 286*  BUN 61* 73* 93* 105* 109*  CREATININE 1.88* 1.88* 2.20* 2.29* 2.06*  CALCIUM 8.6* 9.1 9.0 8.9 8.9  MG 1.9  1.9 2.1 2.1 2.3 2.2  PHOS 4.0 5.2* 5.4* 5.7* 4.9*   Liver Function Tests: Recent Labs  Lab 03/01/21 0248 03/02/21 0500 03/03/21 0514 03/04/21 0548 03/05/21 0428  AST 52* 42* 53* 38 33  ALT 35 33 44 46* 48*  ALKPHOS 65 62 60 60 59  BILITOT 0.8 0.7 0.5 0.7 0.7  PROT 7.1 6.9 6.7 6.7 6.6  ALBUMIN 3.6 3.6 3.5 3.5 3.6   No results for input(s): LIPASE, AMYLASE in the last 168 hours. No results for input(s): AMMONIA in the last 168 hours. CBC: Recent Labs  Lab 03/01/21 0248 03/02/21 0500 03/03/21 0514 03/04/21 0548 03/05/21 0428  WBC 6.3 6.3 8.5 8.6 10.3  NEUTROABS 4.5 3.7 6.3 6.7 7.2  HGB 11.1* 11.6* 11.0* 11.4* 11.7*  HCT 33.2* 33.4* 32.3* 32.8* 34.6*  MCV 92.7 90.5 92.3 89.9 91.1  PLT 143* 157 156 178 176   Cardiac Enzymes: Recent Labs  Lab 03/01/21 0248  CKTOTAL 418*   BNP: Invalid input(s): POCBNP CBG: Recent Labs  Lab 03/04/21 2032 03/04/21 2338 03/05/21 0511 03/05/21 0737 03/05/21 1152  GLUCAP 407*  405* 351* 244* 222* 246*   D-Dimer No results for input(s): DDIMER in the last 72 hours. Hgb A1c No results for input(s): HGBA1C in the last 72 hours. Lipid Profile No results for input(s): CHOL, HDL, LDLCALC, TRIG, CHOLHDL, LDLDIRECT in the last 72 hours. Thyroid function studies No results for input(s): TSH, T4TOTAL, T3FREE, THYROIDAB in the last 72 hours.  Invalid input(s): FREET3 Anemia work up Recent Labs    03/04/21 0548 03/05/21 0428  FERRITIN 689* 755*   Urinalysis    Component Value Date/Time   COLORURINE YELLOW 09/07/2009 1008   APPEARANCEUR CLEAR 09/07/2009 1008   LABSPEC 1.011 09/07/2009 1008   PHURINE 6.5 09/07/2009 1008   [place] 09/07/2009 1008   HGBUR NEGATIVE 09/07/2009 1008   [place] 09/07/2009 1008   [place] 09/07/2009 1008    PROTEINUR NEGATIVE 09/07/2009 1008   UROBILINOGEN 1.0 09/07/2009 1008   NITRITE NEGATIVE 09/07/2009 1008   LEUKOCYTESUR MODERATE (A) 09/07/2009 1008   Sepsis Labs Invalid input(s): PROCALCITONIN,  WBC,  LACTICIDVEN Microbiology Recent Results (from the past 240 hour(s))  Resp Panel by RT-PCR (Flu A&B, Covid) Nasopharyngeal Swab     Status: Abnormal   Collection Time: 02/28/21 11:26 PM   Specimen: Nasopharyngeal Swab; Nasopharyngeal(NP) swabs in vial transport medium  Result Value Ref Range Status   SARS Coronavirus 2 by RT PCR POSITIVE (A) NEGATIVE Final    Comment: RESULT CALLED TO, READ BACK BY AND VERIFIED WITH: Dee Givens @0028  on 03/01/21 SKL (NOTE) SARS-CoV-2 target nucleic acids are DETECTED.  The SARS-CoV-2 RNA is generally detectable in upper respiratory specimens during the acute phase of infection. Positive results are indicative of the presence of the identified virus, but do not rule out bacterial infection or co-infection with other pathogens not detected by the test. Clinical correlation with patient history and other diagnostic information is necessary to determine patient infection status. The expected result is Negative.  Fact Sheet for Patients: EntrepreneurPulse.com.au  Fact Sheet for Healthcare Providers: IncredibleEmployment.be  This test is not yet approved or cleared by the [place] FDA and  has been authorized for detection and/or diagnosis of SARS-CoV-2 by FDA under an Emergency Use Authorization (EUA).  This EUA will remain in effect (meaning this test can be u sed) for the duration  of  the COVID-19 declaration under Section 564(b)(1) of the Act, 21 U.S.C. section 360bbb-3(b)(1), unless the authorization is terminated or revoked sooner.     Influenza A by PCR NEGATIVE NEGATIVE Final   Influenza B by PCR NEGATIVE NEGATIVE Final    Comment: (NOTE) The Xpert Xpress SARS-CoV-2/FLU/RSV plus assay is intended as  an aid in the diagnosis of influenza from Nasopharyngeal swab specimens and should not be used as a sole basis for treatment. Nasal washings and aspirates are unacceptable for Xpert Xpress SARS-CoV-2/FLU/RSV testing.  Fact Sheet for Patients: EntrepreneurPulse.com.au  Fact Sheet for Healthcare Providers: IncredibleEmployment.be  This test is not yet approved or cleared by the [place] FDA and has been authorized for detection and/or diagnosis of SARS-CoV-2 by FDA under an Emergency Use Authorization (EUA). This EUA will remain in effect (meaning this test can be used) for the duration of the COVID-19 declaration under Section 564(b)(1) of the Act, 21 U.S.C. section 360bbb-3(b)(1), unless the authorization is terminated or revoked.  Performed at [facility], [address] [place], [place] [address]      Time coordinating discharge: Over 30 minutes  SIGNED:   Wyvonnia Dusky, MD  [facility] 03/05/2021, 12:23 PM Pager   If 7PM-7AM, please contact night-coverage

## 2021-03-05 NOTE — Plan of Care (Signed)
End of Shift Summary:  Alert and oriented x4. VSS. Remained on room air, sats >93%. Denies pain or n/v. Encouraged use of IS and flutter valve. Urine output adequate. CPAP while sleeping. Blood glucose continuing to trend downward after adjustment to sliding scale. Remained free from falls or injury. Bed low and in locked position. Call bell within reach and able to use.   Problem: Education: Goal: Knowledge of General Education information will improve Description: Including pain rating scale, medication(s)/side effects and non-pharmacologic comfort measures 03/05/2021 0211 by Wende Crease, RN Outcome: Progressing 03/05/2021 0209 by Wende Crease, RN Outcome: Progressing   Problem: Health Behavior/Discharge Planning: Goal: Ability to manage health-related needs will improve 03/05/2021 0211 by Wende Crease, RN Outcome: Progressing 03/05/2021 0209 by Wende Crease, RN Outcome: Progressing   Problem: Clinical Measurements: Goal: Ability to maintain clinical measurements within normal limits will improve 03/05/2021 0211 by Wende Crease, RN Outcome: Progressing 03/05/2021 0209 by Wende Crease, RN Outcome: Progressing Goal: Will remain free from infection 03/05/2021 0211 by Wende Crease, RN Outcome: Progressing 03/05/2021 0209 by Wende Crease, RN Outcome: Progressing Goal: Diagnostic test results will improve 03/05/2021 0211 by Wende Crease, RN Outcome: Progressing 03/05/2021 0209 by Wende Crease, RN Outcome: Progressing Goal: Respiratory complications will improve 03/05/2021 0211 by Wende Crease, RN Outcome: Progressing 03/05/2021 0209 by Wende Crease, RN Outcome: Progressing Goal: Cardiovascular complication will be avoided 03/05/2021 0211 by Wende Crease, RN Outcome: Progressing 03/05/2021 0209 by Wende Crease, RN Outcome: Progressing   Problem: Coping: Goal: Level of anxiety will decrease 03/05/2021 0211  by Wende Crease, RN Outcome: Progressing 03/05/2021 0209 by Wende Crease, RN Outcome: Progressing   Problem: Safety: Goal: Ability to remain free from injury will improve 03/05/2021 0211 by Wende Crease, RN Outcome: Progressing 03/05/2021 0209 by Wende Crease, RN Outcome: Progressing   Problem: Respiratory: Goal: Will maintain a patent airway 03/05/2021 0211 by Wende Crease, RN Outcome: Progressing 03/05/2021 0209 by Wende Crease, RN Outcome: Progressing Goal: Complications related to the disease process, condition or treatment will be avoided or minimized 03/05/2021 0211 by Wende Crease, RN Outcome: Progressing 03/05/2021 0209 by Wende Crease, RN Outcome: Progressing

## 2021-03-06 DIAGNOSIS — Z6841 Body Mass Index (BMI) 40.0 and over, adult: Secondary | ICD-10-CM | POA: Diagnosis not present

## 2021-03-06 DIAGNOSIS — H353213 Exudative age-related macular degeneration, right eye, with inactive scar: Secondary | ICD-10-CM | POA: Diagnosis not present

## 2021-03-06 DIAGNOSIS — H353221 Exudative age-related macular degeneration, left eye, with active choroidal neovascularization: Secondary | ICD-10-CM | POA: Diagnosis not present

## 2021-03-06 DIAGNOSIS — Z7952 Long term (current) use of systemic steroids: Secondary | ICD-10-CM | POA: Diagnosis not present

## 2021-03-06 DIAGNOSIS — K219 Gastro-esophageal reflux disease without esophagitis: Secondary | ICD-10-CM | POA: Diagnosis not present

## 2021-03-06 DIAGNOSIS — Z79899 Other long term (current) drug therapy: Secondary | ICD-10-CM | POA: Diagnosis not present

## 2021-03-06 DIAGNOSIS — J441 Chronic obstructive pulmonary disease with (acute) exacerbation: Secondary | ICD-10-CM | POA: Diagnosis not present

## 2021-03-06 DIAGNOSIS — E1122 Type 2 diabetes mellitus with diabetic chronic kidney disease: Secondary | ICD-10-CM | POA: Diagnosis not present

## 2021-03-06 DIAGNOSIS — Z7982 Long term (current) use of aspirin: Secondary | ICD-10-CM | POA: Diagnosis not present

## 2021-03-06 DIAGNOSIS — I129 Hypertensive chronic kidney disease with stage 1 through stage 4 chronic kidney disease, or unspecified chronic kidney disease: Secondary | ICD-10-CM | POA: Diagnosis not present

## 2021-03-06 DIAGNOSIS — Z87891 Personal history of nicotine dependence: Secondary | ICD-10-CM | POA: Diagnosis not present

## 2021-03-06 DIAGNOSIS — M1990 Unspecified osteoarthritis, unspecified site: Secondary | ICD-10-CM | POA: Diagnosis not present

## 2021-03-06 DIAGNOSIS — I2510 Atherosclerotic heart disease of native coronary artery without angina pectoris: Secondary | ICD-10-CM | POA: Diagnosis not present

## 2021-03-06 DIAGNOSIS — H353134 Nonexudative age-related macular degeneration, bilateral, advanced atrophic with subfoveal involvement: Secondary | ICD-10-CM | POA: Diagnosis not present

## 2021-03-06 DIAGNOSIS — Z7984 Long term (current) use of oral hypoglycemic drugs: Secondary | ICD-10-CM | POA: Diagnosis not present

## 2021-03-06 DIAGNOSIS — N1832 Chronic kidney disease, stage 3b: Secondary | ICD-10-CM | POA: Diagnosis not present

## 2021-03-06 DIAGNOSIS — E782 Mixed hyperlipidemia: Secondary | ICD-10-CM | POA: Diagnosis not present

## 2021-03-06 DIAGNOSIS — U071 COVID-19: Secondary | ICD-10-CM | POA: Diagnosis not present

## 2021-03-16 DIAGNOSIS — G4733 Obstructive sleep apnea (adult) (pediatric): Secondary | ICD-10-CM | POA: Diagnosis not present

## 2021-03-17 DIAGNOSIS — E7800 Pure hypercholesterolemia, unspecified: Secondary | ICD-10-CM | POA: Diagnosis not present

## 2021-03-17 DIAGNOSIS — N1832 Chronic kidney disease, stage 3b: Secondary | ICD-10-CM | POA: Diagnosis not present

## 2021-03-17 DIAGNOSIS — I129 Hypertensive chronic kidney disease with stage 1 through stage 4 chronic kidney disease, or unspecified chronic kidney disease: Secondary | ICD-10-CM | POA: Diagnosis not present

## 2021-03-17 DIAGNOSIS — E1122 Type 2 diabetes mellitus with diabetic chronic kidney disease: Secondary | ICD-10-CM | POA: Diagnosis not present

## 2021-03-17 DIAGNOSIS — F4321 Adjustment disorder with depressed mood: Secondary | ICD-10-CM | POA: Diagnosis not present

## 2021-03-17 DIAGNOSIS — N183 Chronic kidney disease, stage 3 unspecified: Secondary | ICD-10-CM | POA: Diagnosis not present

## 2021-03-17 DIAGNOSIS — I10 Essential (primary) hypertension: Secondary | ICD-10-CM | POA: Diagnosis not present

## 2021-03-17 DIAGNOSIS — E1142 Type 2 diabetes mellitus with diabetic polyneuropathy: Secondary | ICD-10-CM | POA: Diagnosis not present

## 2021-03-17 DIAGNOSIS — I2510 Atherosclerotic heart disease of native coronary artery without angina pectoris: Secondary | ICD-10-CM | POA: Diagnosis not present

## 2021-03-17 DIAGNOSIS — G8929 Other chronic pain: Secondary | ICD-10-CM | POA: Diagnosis not present

## 2021-03-18 DIAGNOSIS — Z7984 Long term (current) use of oral hypoglycemic drugs: Secondary | ICD-10-CM | POA: Diagnosis not present

## 2021-03-18 DIAGNOSIS — H353213 Exudative age-related macular degeneration, right eye, with inactive scar: Secondary | ICD-10-CM | POA: Diagnosis not present

## 2021-03-18 DIAGNOSIS — Z7982 Long term (current) use of aspirin: Secondary | ICD-10-CM | POA: Diagnosis not present

## 2021-03-18 DIAGNOSIS — N1832 Chronic kidney disease, stage 3b: Secondary | ICD-10-CM | POA: Diagnosis not present

## 2021-03-18 DIAGNOSIS — U071 COVID-19: Secondary | ICD-10-CM | POA: Diagnosis not present

## 2021-03-18 DIAGNOSIS — Z87891 Personal history of nicotine dependence: Secondary | ICD-10-CM | POA: Diagnosis not present

## 2021-03-18 DIAGNOSIS — Z79899 Other long term (current) drug therapy: Secondary | ICD-10-CM | POA: Diagnosis not present

## 2021-03-18 DIAGNOSIS — J441 Chronic obstructive pulmonary disease with (acute) exacerbation: Secondary | ICD-10-CM | POA: Diagnosis not present

## 2021-03-18 DIAGNOSIS — K219 Gastro-esophageal reflux disease without esophagitis: Secondary | ICD-10-CM | POA: Diagnosis not present

## 2021-03-18 DIAGNOSIS — Z6841 Body Mass Index (BMI) 40.0 and over, adult: Secondary | ICD-10-CM | POA: Diagnosis not present

## 2021-03-18 DIAGNOSIS — H353221 Exudative age-related macular degeneration, left eye, with active choroidal neovascularization: Secondary | ICD-10-CM | POA: Diagnosis not present

## 2021-03-18 DIAGNOSIS — E1122 Type 2 diabetes mellitus with diabetic chronic kidney disease: Secondary | ICD-10-CM | POA: Diagnosis not present

## 2021-03-18 DIAGNOSIS — I2510 Atherosclerotic heart disease of native coronary artery without angina pectoris: Secondary | ICD-10-CM | POA: Diagnosis not present

## 2021-03-18 DIAGNOSIS — M1990 Unspecified osteoarthritis, unspecified site: Secondary | ICD-10-CM | POA: Diagnosis not present

## 2021-03-18 DIAGNOSIS — H353134 Nonexudative age-related macular degeneration, bilateral, advanced atrophic with subfoveal involvement: Secondary | ICD-10-CM | POA: Diagnosis not present

## 2021-03-18 DIAGNOSIS — I10 Essential (primary) hypertension: Secondary | ICD-10-CM | POA: Diagnosis not present

## 2021-03-18 DIAGNOSIS — Z7952 Long term (current) use of systemic steroids: Secondary | ICD-10-CM | POA: Diagnosis not present

## 2021-03-18 DIAGNOSIS — E782 Mixed hyperlipidemia: Secondary | ICD-10-CM | POA: Diagnosis not present

## 2021-03-18 DIAGNOSIS — I129 Hypertensive chronic kidney disease with stage 1 through stage 4 chronic kidney disease, or unspecified chronic kidney disease: Secondary | ICD-10-CM | POA: Diagnosis not present

## 2021-03-19 DIAGNOSIS — I129 Hypertensive chronic kidney disease with stage 1 through stage 4 chronic kidney disease, or unspecified chronic kidney disease: Secondary | ICD-10-CM | POA: Diagnosis not present

## 2021-03-19 DIAGNOSIS — Z79899 Other long term (current) drug therapy: Secondary | ICD-10-CM | POA: Diagnosis not present

## 2021-03-19 DIAGNOSIS — M1990 Unspecified osteoarthritis, unspecified site: Secondary | ICD-10-CM | POA: Diagnosis not present

## 2021-03-19 DIAGNOSIS — E782 Mixed hyperlipidemia: Secondary | ICD-10-CM | POA: Diagnosis not present

## 2021-03-19 DIAGNOSIS — E1122 Type 2 diabetes mellitus with diabetic chronic kidney disease: Secondary | ICD-10-CM | POA: Diagnosis not present

## 2021-03-19 DIAGNOSIS — Z7984 Long term (current) use of oral hypoglycemic drugs: Secondary | ICD-10-CM | POA: Diagnosis not present

## 2021-03-19 DIAGNOSIS — H353213 Exudative age-related macular degeneration, right eye, with inactive scar: Secondary | ICD-10-CM | POA: Diagnosis not present

## 2021-03-19 DIAGNOSIS — Z7982 Long term (current) use of aspirin: Secondary | ICD-10-CM | POA: Diagnosis not present

## 2021-03-19 DIAGNOSIS — K219 Gastro-esophageal reflux disease without esophagitis: Secondary | ICD-10-CM | POA: Diagnosis not present

## 2021-03-19 DIAGNOSIS — Z7952 Long term (current) use of systemic steroids: Secondary | ICD-10-CM | POA: Diagnosis not present

## 2021-03-19 DIAGNOSIS — H353221 Exudative age-related macular degeneration, left eye, with active choroidal neovascularization: Secondary | ICD-10-CM | POA: Diagnosis not present

## 2021-03-19 DIAGNOSIS — U071 COVID-19: Secondary | ICD-10-CM | POA: Diagnosis not present

## 2021-03-19 DIAGNOSIS — I2510 Atherosclerotic heart disease of native coronary artery without angina pectoris: Secondary | ICD-10-CM | POA: Diagnosis not present

## 2021-03-19 DIAGNOSIS — J441 Chronic obstructive pulmonary disease with (acute) exacerbation: Secondary | ICD-10-CM | POA: Diagnosis not present

## 2021-03-19 DIAGNOSIS — Z6841 Body Mass Index (BMI) 40.0 and over, adult: Secondary | ICD-10-CM | POA: Diagnosis not present

## 2021-03-19 DIAGNOSIS — N1832 Chronic kidney disease, stage 3b: Secondary | ICD-10-CM | POA: Diagnosis not present

## 2021-03-19 DIAGNOSIS — Z87891 Personal history of nicotine dependence: Secondary | ICD-10-CM | POA: Diagnosis not present

## 2021-03-19 DIAGNOSIS — H353134 Nonexudative age-related macular degeneration, bilateral, advanced atrophic with subfoveal involvement: Secondary | ICD-10-CM | POA: Diagnosis not present

## 2021-03-23 DIAGNOSIS — H353221 Exudative age-related macular degeneration, left eye, with active choroidal neovascularization: Secondary | ICD-10-CM | POA: Diagnosis not present

## 2021-03-23 DIAGNOSIS — Z87891 Personal history of nicotine dependence: Secondary | ICD-10-CM | POA: Diagnosis not present

## 2021-03-23 DIAGNOSIS — Z7984 Long term (current) use of oral hypoglycemic drugs: Secondary | ICD-10-CM | POA: Diagnosis not present

## 2021-03-23 DIAGNOSIS — I129 Hypertensive chronic kidney disease with stage 1 through stage 4 chronic kidney disease, or unspecified chronic kidney disease: Secondary | ICD-10-CM | POA: Diagnosis not present

## 2021-03-23 DIAGNOSIS — J441 Chronic obstructive pulmonary disease with (acute) exacerbation: Secondary | ICD-10-CM | POA: Diagnosis not present

## 2021-03-23 DIAGNOSIS — K219 Gastro-esophageal reflux disease without esophagitis: Secondary | ICD-10-CM | POA: Diagnosis not present

## 2021-03-23 DIAGNOSIS — Z6841 Body Mass Index (BMI) 40.0 and over, adult: Secondary | ICD-10-CM | POA: Diagnosis not present

## 2021-03-23 DIAGNOSIS — E1122 Type 2 diabetes mellitus with diabetic chronic kidney disease: Secondary | ICD-10-CM | POA: Diagnosis not present

## 2021-03-23 DIAGNOSIS — Z79899 Other long term (current) drug therapy: Secondary | ICD-10-CM | POA: Diagnosis not present

## 2021-03-23 DIAGNOSIS — I2510 Atherosclerotic heart disease of native coronary artery without angina pectoris: Secondary | ICD-10-CM | POA: Diagnosis not present

## 2021-03-23 DIAGNOSIS — Z7982 Long term (current) use of aspirin: Secondary | ICD-10-CM | POA: Diagnosis not present

## 2021-03-23 DIAGNOSIS — H353134 Nonexudative age-related macular degeneration, bilateral, advanced atrophic with subfoveal involvement: Secondary | ICD-10-CM | POA: Diagnosis not present

## 2021-03-23 DIAGNOSIS — Z7952 Long term (current) use of systemic steroids: Secondary | ICD-10-CM | POA: Diagnosis not present

## 2021-03-23 DIAGNOSIS — E782 Mixed hyperlipidemia: Secondary | ICD-10-CM | POA: Diagnosis not present

## 2021-03-23 DIAGNOSIS — M1990 Unspecified osteoarthritis, unspecified site: Secondary | ICD-10-CM | POA: Diagnosis not present

## 2021-03-23 DIAGNOSIS — U071 COVID-19: Secondary | ICD-10-CM | POA: Diagnosis not present

## 2021-03-23 DIAGNOSIS — N1832 Chronic kidney disease, stage 3b: Secondary | ICD-10-CM | POA: Diagnosis not present

## 2021-03-23 DIAGNOSIS — H353213 Exudative age-related macular degeneration, right eye, with inactive scar: Secondary | ICD-10-CM | POA: Diagnosis not present

## 2021-03-24 DIAGNOSIS — R5381 Other malaise: Secondary | ICD-10-CM | POA: Diagnosis not present

## 2021-03-24 DIAGNOSIS — R296 Repeated falls: Secondary | ICD-10-CM | POA: Diagnosis not present

## 2021-03-24 DIAGNOSIS — F32A Depression, unspecified: Secondary | ICD-10-CM | POA: Diagnosis not present

## 2021-04-08 ENCOUNTER — Other Ambulatory Visit: Payer: Self-pay

## 2021-04-08 ENCOUNTER — Encounter (INDEPENDENT_AMBULATORY_CARE_PROVIDER_SITE_OTHER): Payer: Self-pay | Admitting: Ophthalmology

## 2021-04-08 ENCOUNTER — Ambulatory Visit (INDEPENDENT_AMBULATORY_CARE_PROVIDER_SITE_OTHER): Payer: PPO | Admitting: Ophthalmology

## 2021-04-08 DIAGNOSIS — H353222 Exudative age-related macular degeneration, left eye, with inactive choroidal neovascularization: Secondary | ICD-10-CM

## 2021-04-08 DIAGNOSIS — H353213 Exudative age-related macular degeneration, right eye, with inactive scar: Secondary | ICD-10-CM | POA: Diagnosis not present

## 2021-04-08 DIAGNOSIS — H353221 Exudative age-related macular degeneration, left eye, with active choroidal neovascularization: Secondary | ICD-10-CM | POA: Diagnosis not present

## 2021-04-08 DIAGNOSIS — H353134 Nonexudative age-related macular degeneration, bilateral, advanced atrophic with subfoveal involvement: Secondary | ICD-10-CM

## 2021-04-08 NOTE — Progress Notes (Signed)
04/08/2021     CHIEF COMPLAINT Patient presents for Macular Degeneration (History of bilateral macular disciform scar.  Most recently had edges of activity around the disciform scar left eye risking an enlargement of macular scotoma.  Most recent evaluation 5 months previous with injection Avastin at that time for maintenance OS)   HISTORY OF PRESENT ILLNESS: Jimmy Butler is a 87 y.o. male who presents to the clinic today for:   HPI     Macular Degeneration           Comments: History of bilateral macular disciform scar.  Most recently had edges of activity around the disciform scar left eye risking an enlargement of macular scotoma.  Most recent evaluation 5 months previous with injection Avastin at that time for maintenance OS       Last edited by Hurman Horn, MD on 04/08/2021  9:55 AM.      Referring physician: Josetta Huddle, MD [address] [place],  [place] [address]  HISTORICAL INFORMATION:   Selected notes from the MEDICAL RECORD NUMBER    Lab Results  Component Value Date   HGBA1C 6.5 (H) 03/01/2021     CURRENT MEDICATIONS: No current outpatient medications on file. (Ophthalmic Drugs)   No current facility-administered medications for this visit. (Ophthalmic Drugs)   Current Outpatient Medications (Other)  Medication Sig   amLODipine (NORVASC) 10 MG tablet Take 1 tablet by mouth daily.   aspirin 81 MG EC tablet Take 81 mg by mouth daily.   atorvastatin (LIPITOR) 40 MG tablet Take 1 tablet by mouth daily.   benzonatate (TESSALON) 100 MG capsule Take 100 mg by mouth 3 (three) times daily as needed for cough.   carvedilol (COREG) 6.25 MG tablet Take 6.25 mg by mouth 2 (two) times daily.   chlorthalidone (HYGROTON) 25 MG tablet Take 25 mg by mouth daily.   glimepiride (AMARYL) 2 MG tablet Take 2 mg by mouth daily.   metFORMIN (GLUCOPHAGE-XR) 500 MG 24 hr tablet Take 500 mg by mouth daily.   telmisartan (MICARDIS) 80 MG tablet Take 80 mg by  mouth daily.   TRULICITY 2.42 AS/3.4HD SOPN Inject 0.75 mg into the skin once a week.   vitamin B-12 (CYANOCOBALAMIN) 1000 MCG tablet Take 1,000 mcg by mouth daily.     No current facility-administered medications for this visit. (Other)      REVIEW OF SYSTEMS:    ALLERGIES No Known Allergies  PAST MEDICAL HISTORY Past Medical History:  Diagnosis Date   Diabetes mellitus without complication ([place])    History reviewed. No pertinent surgical history.  FAMILY HISTORY Family History  Problem Relation Age of Onset   Heart disease Father     SOCIAL HISTORY Social History   Tobacco Use   Smoking status: Former    Packs/day: 1.50    Years: 40.00    Pack years: 60.00    Types: Cigarettes    Quit date: 09/19/1968    Years since quitting: 52.5   Smokeless tobacco: Never  Substance Use Topics   Alcohol use: No   Drug use: No         OPHTHALMIC EXAM:  Base Eye Exam     Visual Acuity (ETDRS)       Right Left   Dist [place] CF at 3' 20/200         Tonometry (Tonopen, 9:30 AM)       Right Left   Pressure 13 13  Dilation     Both eyes: 1.0% Mydriacyl, 2.5% Phenylephrine @ 9:31 AM           Slit Lamp and Fundus Exam     External Exam       Right Left   External Normal Normal         Slit Lamp Exam       Right Left   Lids/Lashes Normal Normal   Conjunctiva/Sclera White and quiet White and quiet   Cornea Clear Clear   Anterior Chamber Deep and quiet Deep and quiet   Iris Round and reactive Round and reactive   Lens Posterior chamber intraocular lens Posterior chamber intraocular lens   Anterior Vitreous Normal Normal         Fundus Exam       Right Left   Posterior Vitreous Posterior vitreous detachment Posterior vitreous detachment   Disc Normal Normal   C/D Ratio 0.25 0.2   Macula Disciform scar 22 da size, no hemorrhage, Subretinal fibrosis,, 12-15 disc areas in size centrally no active edges, no macular thickening, large area  of geographic atrophy in the macula Drusen, Disciform scar large , large with pigmentary ring.  No active subretinal hemorrhage or fluid peripherally.  No signs of active disease   Vessels Normal Normal   Periphery Normal Normal            IMAGING AND PROCEDURES  Imaging and Procedures for 04/08/21  OCT, Retina - OU - Both Eyes       Right Eye Quality was good. Scan locations included subfoveal. Central Foveal Thickness: 487. Progression has been stable. Findings include abnormal foveal contour, disciform scar, no IRF, inner retinal atrophy, central retinal atrophy, subretinal scarring, outer retinal atrophy.   Left Eye Quality was good. Scan locations included subfoveal. Central Foveal Thickness: 276. Progression has been stable. Findings include abnormal foveal contour, no SRF, disciform scar, outer retinal atrophy, subretinal scarring.   Notes OS with fewer active edges to the lesion.  Now 5 months post Avastin.   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 Exudative age-related macular degeneration of left eye with active choroidal neovascularization (HCC)  H35.3221 OCT, Retina - OU - Both Eyes    2. Exudative age-related macular degeneration of right eye with inactive scar (HCC)  H35.3213 OCT, Retina - OU - Both Eyes    3. 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 Exudative age-related macular  degeneration of left eye with inactive choroidal neovascularization ([place])  H35.3222       1.  History of bilateral disciform scars, quiet OD, more activity OS in recent years yet now at 57-month follow-up no active lesion growth or scotoma enlargement.  No intraretinal fluid.  We will thus observe OS as well today  2.  Dilate OU follow-up in 8 months  3.  Ophthalmic Meds Ordered this visit:  No orders of the defined types were placed in  this encounter.      Return in about 8 months (around 12/07/2021) for DILATE OU, COLOR FP, OCT.  There are no Patient Instructions on file for this visit.   Explained the diagnoses, plan, and follow up with the patient and they expressed understanding.  Patient expressed understanding of the importance of proper follow up care.   Clent Demark Hitesh Fouche M.D. Diseases & Surgery of the Retina and Vitreous [facility] [place] 04/08/21     Abbreviations: M myopia (nearsighted); A astigmatism; H hyperopia (farsighted); P presbyopia; Mrx spectacle prescription;  CTL contact lenses; OD right eye; OS left eye; OU both eyes  XT exotropia; ET esotropia; PEK punctate epithelial keratitis; PEE punctate epithelial erosions; DES dry eye syndrome; MGD meibomian gland dysfunction; ATs artificial tears; PFAT's preservative free artificial tears; [place] nuclear sclerotic cataract; PSC posterior subcapsular cataract; ERM epi-retinal membrane; PVD posterior vitreous detachment; RD retinal detachment; DM diabetes mellitus; DR diabetic retinopathy; NPDR non-proliferative diabetic retinopathy; PDR proliferative diabetic retinopathy; CSME clinically significant macular edema; DME diabetic macular edema; dbh dot blot hemorrhages; CWS cotton wool spot; POAG primary open angle glaucoma; C/D cup-to-disc ratio; HVF humphrey visual field; GVF goldmann visual field; OCT optical coherence tomography; IOP intraocular pressure; BRVO Branch retinal vein occlusion; CRVO central retinal vein  occlusion; CRAO central retinal artery occlusion; BRAO branch retinal artery occlusion; RT retinal tear; SB scleral buckle; PPV pars plana vitrectomy; VH Vitreous hemorrhage; PRP panretinal laser photocoagulation; IVK intravitreal kenalog; VMT vitreomacular traction; MH Macular hole;  NVD neovascularization of the disc; NVE neovascularization elsewhere; AREDS age related eye disease study; ARMD age related macular degeneration; POAG primary open angle glaucoma; EBMD epithelial/anterior basement membrane dystrophy; ACIOL anterior chamber intraocular lens; IOL intraocular lens; PCIOL posterior chamber intraocular lens; Phaco/IOL phacoemulsification with intraocular lens placement; [place] photorefractive keratectomy; LASIK laser assisted in situ keratomileusis; HTN hypertension; DM diabetes mellitus; COPD chronic obstructive pulmonary disease

## 2021-04-08 NOTE — Assessment & Plan Note (Signed)
No signs of active CNVM OU D today will observe

## 2021-04-08 NOTE — Assessment & Plan Note (Signed)
Geographic atrophy surrounds large disciform scar OD

## 2021-04-08 NOTE — Assessment & Plan Note (Signed)
No signs of active CNVM today will observe 

## 2021-04-13 DIAGNOSIS — I10 Essential (primary) hypertension: Secondary | ICD-10-CM | POA: Diagnosis not present

## 2021-04-13 DIAGNOSIS — E1142 Type 2 diabetes mellitus with diabetic polyneuropathy: Secondary | ICD-10-CM | POA: Diagnosis not present

## 2021-04-13 DIAGNOSIS — N183 Chronic kidney disease, stage 3 unspecified: Secondary | ICD-10-CM | POA: Diagnosis not present

## 2021-04-13 DIAGNOSIS — E7800 Pure hypercholesterolemia, unspecified: Secondary | ICD-10-CM | POA: Diagnosis not present

## 2021-04-13 DIAGNOSIS — E1122 Type 2 diabetes mellitus with diabetic chronic kidney disease: Secondary | ICD-10-CM | POA: Diagnosis not present

## 2021-04-13 DIAGNOSIS — I2510 Atherosclerotic heart disease of native coronary artery without angina pectoris: Secondary | ICD-10-CM | POA: Diagnosis not present

## 2021-04-13 DIAGNOSIS — N1832 Chronic kidney disease, stage 3b: Secondary | ICD-10-CM | POA: Diagnosis not present

## 2021-04-13 DIAGNOSIS — I129 Hypertensive chronic kidney disease with stage 1 through stage 4 chronic kidney disease, or unspecified chronic kidney disease: Secondary | ICD-10-CM | POA: Diagnosis not present

## 2021-04-13 DIAGNOSIS — F4321 Adjustment disorder with depressed mood: Secondary | ICD-10-CM | POA: Diagnosis not present

## 2021-04-13 DIAGNOSIS — G8929 Other chronic pain: Secondary | ICD-10-CM | POA: Diagnosis not present

## 2021-04-16 DIAGNOSIS — I129 Hypertensive chronic kidney disease with stage 1 through stage 4 chronic kidney disease, or unspecified chronic kidney disease: Secondary | ICD-10-CM | POA: Diagnosis not present

## 2021-04-20 DIAGNOSIS — E782 Mixed hyperlipidemia: Secondary | ICD-10-CM | POA: Diagnosis not present

## 2021-04-20 DIAGNOSIS — H353221 Exudative age-related macular degeneration, left eye, with active choroidal neovascularization: Secondary | ICD-10-CM | POA: Diagnosis not present

## 2021-04-20 DIAGNOSIS — Z7952 Long term (current) use of systemic steroids: Secondary | ICD-10-CM | POA: Diagnosis not present

## 2021-04-20 DIAGNOSIS — Z79899 Other long term (current) drug therapy: Secondary | ICD-10-CM | POA: Diagnosis not present

## 2021-04-20 DIAGNOSIS — Z7982 Long term (current) use of aspirin: Secondary | ICD-10-CM | POA: Diagnosis not present

## 2021-04-20 DIAGNOSIS — I129 Hypertensive chronic kidney disease with stage 1 through stage 4 chronic kidney disease, or unspecified chronic kidney disease: Secondary | ICD-10-CM | POA: Diagnosis not present

## 2021-04-20 DIAGNOSIS — E1122 Type 2 diabetes mellitus with diabetic chronic kidney disease: Secondary | ICD-10-CM | POA: Diagnosis not present

## 2021-04-20 DIAGNOSIS — H353134 Nonexudative age-related macular degeneration, bilateral, advanced atrophic with subfoveal involvement: Secondary | ICD-10-CM | POA: Diagnosis not present

## 2021-04-20 DIAGNOSIS — Z6841 Body Mass Index (BMI) 40.0 and over, adult: Secondary | ICD-10-CM | POA: Diagnosis not present

## 2021-04-20 DIAGNOSIS — U071 COVID-19: Secondary | ICD-10-CM | POA: Diagnosis not present

## 2021-04-20 DIAGNOSIS — I2510 Atherosclerotic heart disease of native coronary artery without angina pectoris: Secondary | ICD-10-CM | POA: Diagnosis not present

## 2021-04-20 DIAGNOSIS — J441 Chronic obstructive pulmonary disease with (acute) exacerbation: Secondary | ICD-10-CM | POA: Diagnosis not present

## 2021-04-20 DIAGNOSIS — H353213 Exudative age-related macular degeneration, right eye, with inactive scar: Secondary | ICD-10-CM | POA: Diagnosis not present

## 2021-04-20 DIAGNOSIS — K219 Gastro-esophageal reflux disease without esophagitis: Secondary | ICD-10-CM | POA: Diagnosis not present

## 2021-04-20 DIAGNOSIS — Z7984 Long term (current) use of oral hypoglycemic drugs: Secondary | ICD-10-CM | POA: Diagnosis not present

## 2021-04-20 DIAGNOSIS — Z87891 Personal history of nicotine dependence: Secondary | ICD-10-CM | POA: Diagnosis not present

## 2021-04-20 DIAGNOSIS — N1832 Chronic kidney disease, stage 3b: Secondary | ICD-10-CM | POA: Diagnosis not present

## 2021-04-20 DIAGNOSIS — M1990 Unspecified osteoarthritis, unspecified site: Secondary | ICD-10-CM | POA: Diagnosis not present

## 2021-05-03 DIAGNOSIS — Z79899 Other long term (current) drug therapy: Secondary | ICD-10-CM | POA: Diagnosis not present

## 2021-05-03 DIAGNOSIS — Z7952 Long term (current) use of systemic steroids: Secondary | ICD-10-CM | POA: Diagnosis not present

## 2021-05-03 DIAGNOSIS — J441 Chronic obstructive pulmonary disease with (acute) exacerbation: Secondary | ICD-10-CM | POA: Diagnosis not present

## 2021-05-03 DIAGNOSIS — H353221 Exudative age-related macular degeneration, left eye, with active choroidal neovascularization: Secondary | ICD-10-CM | POA: Diagnosis not present

## 2021-05-03 DIAGNOSIS — E782 Mixed hyperlipidemia: Secondary | ICD-10-CM | POA: Diagnosis not present

## 2021-05-03 DIAGNOSIS — Z87891 Personal history of nicotine dependence: Secondary | ICD-10-CM | POA: Diagnosis not present

## 2021-05-03 DIAGNOSIS — M1990 Unspecified osteoarthritis, unspecified site: Secondary | ICD-10-CM | POA: Diagnosis not present

## 2021-05-03 DIAGNOSIS — I2510 Atherosclerotic heart disease of native coronary artery without angina pectoris: Secondary | ICD-10-CM | POA: Diagnosis not present

## 2021-05-03 DIAGNOSIS — H353213 Exudative age-related macular degeneration, right eye, with inactive scar: Secondary | ICD-10-CM | POA: Diagnosis not present

## 2021-05-03 DIAGNOSIS — U071 COVID-19: Secondary | ICD-10-CM | POA: Diagnosis not present

## 2021-05-03 DIAGNOSIS — E1122 Type 2 diabetes mellitus with diabetic chronic kidney disease: Secondary | ICD-10-CM | POA: Diagnosis not present

## 2021-05-03 DIAGNOSIS — H353134 Nonexudative age-related macular degeneration, bilateral, advanced atrophic with subfoveal involvement: Secondary | ICD-10-CM | POA: Diagnosis not present

## 2021-05-03 DIAGNOSIS — K219 Gastro-esophageal reflux disease without esophagitis: Secondary | ICD-10-CM | POA: Diagnosis not present

## 2021-05-03 DIAGNOSIS — N1832 Chronic kidney disease, stage 3b: Secondary | ICD-10-CM | POA: Diagnosis not present

## 2021-05-03 DIAGNOSIS — I129 Hypertensive chronic kidney disease with stage 1 through stage 4 chronic kidney disease, or unspecified chronic kidney disease: Secondary | ICD-10-CM | POA: Diagnosis not present

## 2021-05-03 DIAGNOSIS — Z6841 Body Mass Index (BMI) 40.0 and over, adult: Secondary | ICD-10-CM | POA: Diagnosis not present

## 2021-05-03 DIAGNOSIS — Z7982 Long term (current) use of aspirin: Secondary | ICD-10-CM | POA: Diagnosis not present

## 2021-05-03 DIAGNOSIS — Z7984 Long term (current) use of oral hypoglycemic drugs: Secondary | ICD-10-CM | POA: Diagnosis not present

## 2021-05-14 DIAGNOSIS — E1122 Type 2 diabetes mellitus with diabetic chronic kidney disease: Secondary | ICD-10-CM | POA: Diagnosis not present

## 2021-05-14 DIAGNOSIS — I10 Essential (primary) hypertension: Secondary | ICD-10-CM | POA: Diagnosis not present

## 2021-05-14 DIAGNOSIS — I2510 Atherosclerotic heart disease of native coronary artery without angina pectoris: Secondary | ICD-10-CM | POA: Diagnosis not present

## 2021-05-14 DIAGNOSIS — N1832 Chronic kidney disease, stage 3b: Secondary | ICD-10-CM | POA: Diagnosis not present

## 2021-05-14 DIAGNOSIS — G8929 Other chronic pain: Secondary | ICD-10-CM | POA: Diagnosis not present

## 2021-05-14 DIAGNOSIS — N183 Chronic kidney disease, stage 3 unspecified: Secondary | ICD-10-CM | POA: Diagnosis not present

## 2021-05-14 DIAGNOSIS — E1142 Type 2 diabetes mellitus with diabetic polyneuropathy: Secondary | ICD-10-CM | POA: Diagnosis not present

## 2021-05-14 DIAGNOSIS — I129 Hypertensive chronic kidney disease with stage 1 through stage 4 chronic kidney disease, or unspecified chronic kidney disease: Secondary | ICD-10-CM | POA: Diagnosis not present

## 2021-05-14 DIAGNOSIS — E7800 Pure hypercholesterolemia, unspecified: Secondary | ICD-10-CM | POA: Diagnosis not present

## 2021-05-14 DIAGNOSIS — F4321 Adjustment disorder with depressed mood: Secondary | ICD-10-CM | POA: Diagnosis not present

## 2021-05-19 DIAGNOSIS — I10 Essential (primary) hypertension: Secondary | ICD-10-CM | POA: Diagnosis not present

## 2021-06-03 DIAGNOSIS — G4733 Obstructive sleep apnea (adult) (pediatric): Secondary | ICD-10-CM | POA: Diagnosis not present

## 2021-06-09 DIAGNOSIS — Z8616 Personal history of COVID-19: Secondary | ICD-10-CM | POA: Diagnosis not present

## 2021-06-09 DIAGNOSIS — N1832 Chronic kidney disease, stage 3b: Secondary | ICD-10-CM | POA: Diagnosis not present

## 2021-06-09 DIAGNOSIS — J3489 Other specified disorders of nose and nasal sinuses: Secondary | ICD-10-CM | POA: Diagnosis not present

## 2021-06-09 DIAGNOSIS — E1122 Type 2 diabetes mellitus with diabetic chronic kidney disease: Secondary | ICD-10-CM | POA: Diagnosis not present

## 2021-06-09 DIAGNOSIS — Z23 Encounter for immunization: Secondary | ICD-10-CM | POA: Diagnosis not present

## 2021-06-09 DIAGNOSIS — I129 Hypertensive chronic kidney disease with stage 1 through stage 4 chronic kidney disease, or unspecified chronic kidney disease: Secondary | ICD-10-CM | POA: Diagnosis not present

## 2021-06-09 DIAGNOSIS — I2510 Atherosclerotic heart disease of native coronary artery without angina pectoris: Secondary | ICD-10-CM | POA: Diagnosis not present

## 2021-06-09 DIAGNOSIS — G4733 Obstructive sleep apnea (adult) (pediatric): Secondary | ICD-10-CM | POA: Diagnosis not present

## 2021-06-09 DIAGNOSIS — E1142 Type 2 diabetes mellitus with diabetic polyneuropathy: Secondary | ICD-10-CM | POA: Diagnosis not present

## 2021-06-18 DIAGNOSIS — I129 Hypertensive chronic kidney disease with stage 1 through stage 4 chronic kidney disease, or unspecified chronic kidney disease: Secondary | ICD-10-CM | POA: Diagnosis not present

## 2021-07-07 DIAGNOSIS — G4733 Obstructive sleep apnea (adult) (pediatric): Secondary | ICD-10-CM | POA: Diagnosis not present

## 2021-07-16 DIAGNOSIS — I10 Essential (primary) hypertension: Secondary | ICD-10-CM | POA: Diagnosis not present

## 2021-07-16 DIAGNOSIS — E1122 Type 2 diabetes mellitus with diabetic chronic kidney disease: Secondary | ICD-10-CM | POA: Diagnosis not present

## 2021-07-16 DIAGNOSIS — E1142 Type 2 diabetes mellitus with diabetic polyneuropathy: Secondary | ICD-10-CM | POA: Diagnosis not present

## 2021-07-16 DIAGNOSIS — F4321 Adjustment disorder with depressed mood: Secondary | ICD-10-CM | POA: Diagnosis not present

## 2021-07-16 DIAGNOSIS — I129 Hypertensive chronic kidney disease with stage 1 through stage 4 chronic kidney disease, or unspecified chronic kidney disease: Secondary | ICD-10-CM | POA: Diagnosis not present

## 2021-07-16 DIAGNOSIS — I2510 Atherosclerotic heart disease of native coronary artery without angina pectoris: Secondary | ICD-10-CM | POA: Diagnosis not present

## 2021-07-16 DIAGNOSIS — E7800 Pure hypercholesterolemia, unspecified: Secondary | ICD-10-CM | POA: Diagnosis not present

## 2021-07-16 DIAGNOSIS — G8929 Other chronic pain: Secondary | ICD-10-CM | POA: Diagnosis not present

## 2021-07-16 DIAGNOSIS — N1832 Chronic kidney disease, stage 3b: Secondary | ICD-10-CM | POA: Diagnosis not present

## 2021-07-19 DIAGNOSIS — I10 Essential (primary) hypertension: Secondary | ICD-10-CM | POA: Diagnosis not present

## 2021-08-03 DIAGNOSIS — E7800 Pure hypercholesterolemia, unspecified: Secondary | ICD-10-CM | POA: Diagnosis not present

## 2021-08-03 DIAGNOSIS — N1832 Chronic kidney disease, stage 3b: Secondary | ICD-10-CM | POA: Diagnosis not present

## 2021-08-03 DIAGNOSIS — E1142 Type 2 diabetes mellitus with diabetic polyneuropathy: Secondary | ICD-10-CM | POA: Diagnosis not present

## 2021-08-03 DIAGNOSIS — G8929 Other chronic pain: Secondary | ICD-10-CM | POA: Diagnosis not present

## 2021-08-03 DIAGNOSIS — I129 Hypertensive chronic kidney disease with stage 1 through stage 4 chronic kidney disease, or unspecified chronic kidney disease: Secondary | ICD-10-CM | POA: Diagnosis not present

## 2021-08-03 DIAGNOSIS — I2510 Atherosclerotic heart disease of native coronary artery without angina pectoris: Secondary | ICD-10-CM | POA: Diagnosis not present

## 2021-08-03 DIAGNOSIS — E1122 Type 2 diabetes mellitus with diabetic chronic kidney disease: Secondary | ICD-10-CM | POA: Diagnosis not present

## 2021-08-03 DIAGNOSIS — F4321 Adjustment disorder with depressed mood: Secondary | ICD-10-CM | POA: Diagnosis not present

## 2021-08-03 DIAGNOSIS — N183 Chronic kidney disease, stage 3 unspecified: Secondary | ICD-10-CM | POA: Diagnosis not present

## 2021-08-03 DIAGNOSIS — I10 Essential (primary) hypertension: Secondary | ICD-10-CM | POA: Diagnosis not present

## 2021-08-10 DIAGNOSIS — G4733 Obstructive sleep apnea (adult) (pediatric): Secondary | ICD-10-CM | POA: Diagnosis not present

## 2021-08-18 DIAGNOSIS — I129 Hypertensive chronic kidney disease with stage 1 through stage 4 chronic kidney disease, or unspecified chronic kidney disease: Secondary | ICD-10-CM | POA: Diagnosis not present

## 2021-09-09 DIAGNOSIS — G4733 Obstructive sleep apnea (adult) (pediatric): Secondary | ICD-10-CM | POA: Diagnosis not present

## 2021-09-17 DIAGNOSIS — I10 Essential (primary) hypertension: Secondary | ICD-10-CM | POA: Diagnosis not present

## 2021-10-06 IMAGING — CR DG CHEST 2V
1 series · 2 of 2 positions shown · non-contrast
Comparison: 01/12/2015

CLINICAL DATA: Shortness of breath

EXAM:
CHEST - 2 VIEW

[Series 1: dg chest 2 view · 0.14mm/px · 2 of 2 slices shown]
[im 1/2]
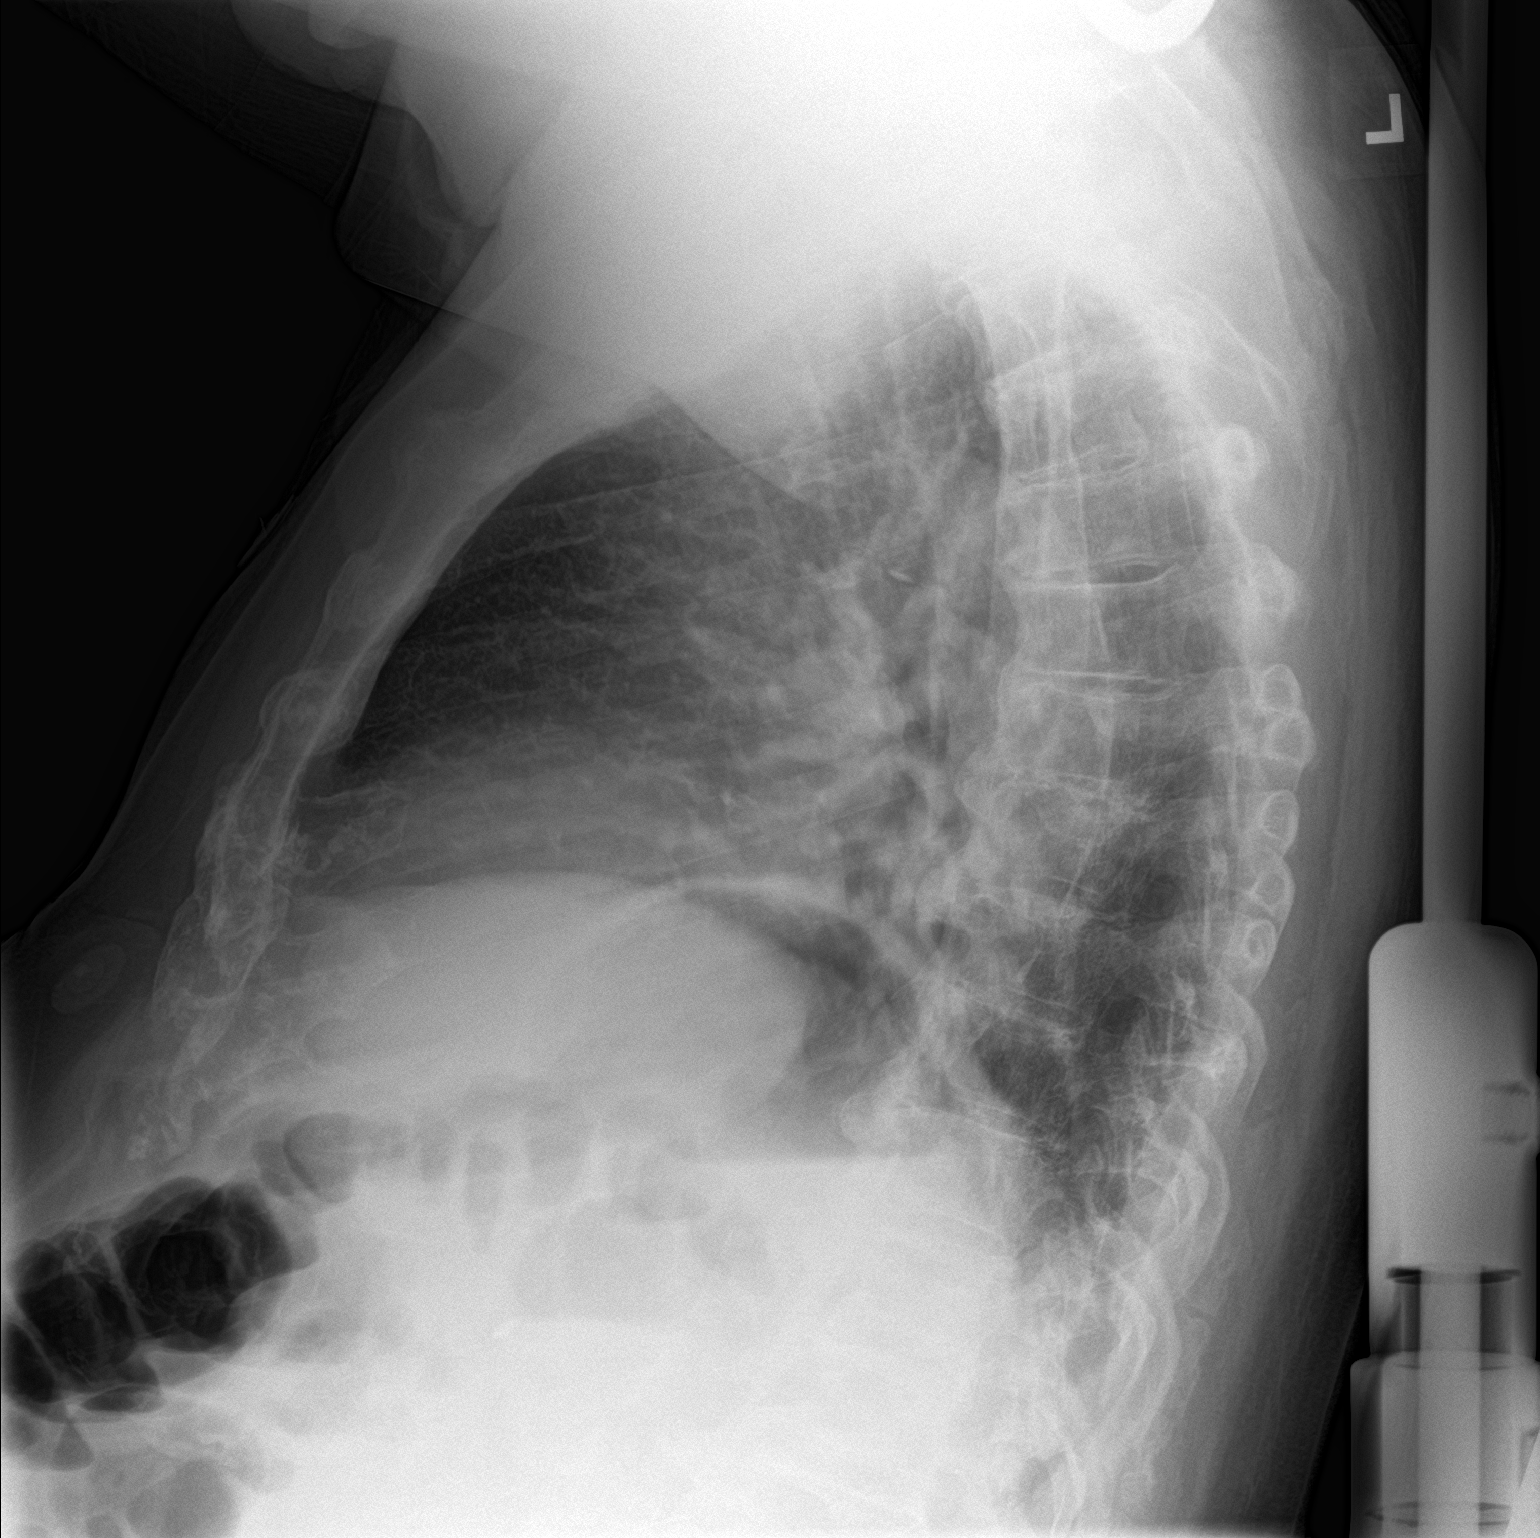
[im 2/2]
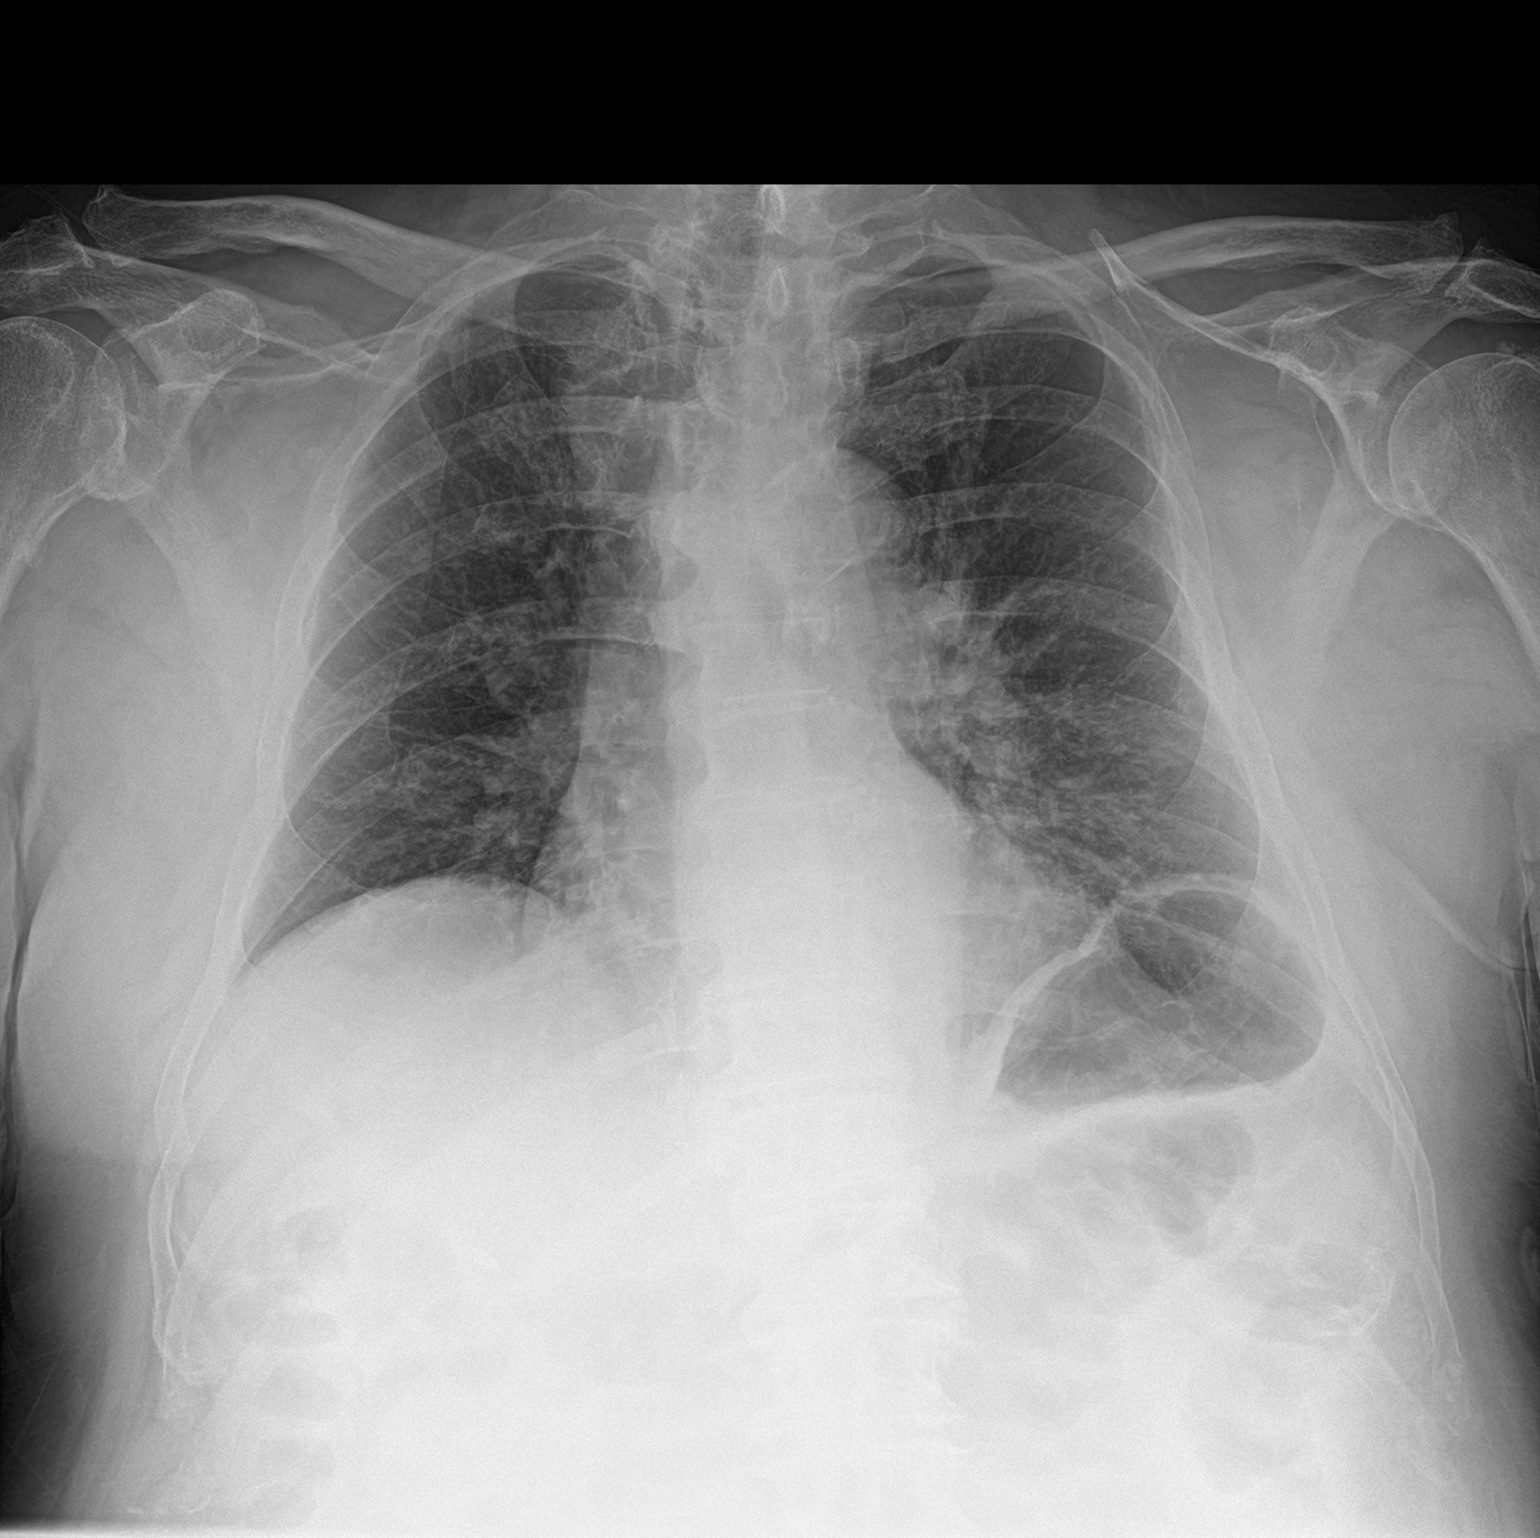

[2 of 2 positions shown; findings below may reference images not displayed]

FINDINGS: Heart is normal size. No confluent airspace opacities or effusions.
No acute bony abnormality.
IMPRESSION: No active cardiopulmonary disease.

## 2021-10-19 DIAGNOSIS — I129 Hypertensive chronic kidney disease with stage 1 through stage 4 chronic kidney disease, or unspecified chronic kidney disease: Secondary | ICD-10-CM | POA: Diagnosis not present

## 2021-10-29 DIAGNOSIS — E1142 Type 2 diabetes mellitus with diabetic polyneuropathy: Secondary | ICD-10-CM | POA: Diagnosis not present

## 2021-10-29 DIAGNOSIS — I10 Essential (primary) hypertension: Secondary | ICD-10-CM | POA: Diagnosis not present

## 2021-10-29 DIAGNOSIS — E7800 Pure hypercholesterolemia, unspecified: Secondary | ICD-10-CM | POA: Diagnosis not present

## 2021-11-16 DIAGNOSIS — I10 Essential (primary) hypertension: Secondary | ICD-10-CM | POA: Diagnosis not present

## 2021-12-07 ENCOUNTER — Encounter (INDEPENDENT_AMBULATORY_CARE_PROVIDER_SITE_OTHER): Payer: PPO | Admitting: Ophthalmology

## 2021-12-07 ENCOUNTER — Encounter (INDEPENDENT_AMBULATORY_CARE_PROVIDER_SITE_OTHER): Payer: Self-pay | Admitting: Ophthalmology

## 2021-12-07 ENCOUNTER — Ambulatory Visit (INDEPENDENT_AMBULATORY_CARE_PROVIDER_SITE_OTHER): Payer: PPO | Admitting: Ophthalmology

## 2021-12-07 ENCOUNTER — Other Ambulatory Visit: Payer: Self-pay

## 2021-12-07 DIAGNOSIS — H353222 Exudative age-related macular degeneration, left eye, with inactive choroidal neovascularization: Secondary | ICD-10-CM | POA: Diagnosis not present

## 2021-12-07 DIAGNOSIS — H353221 Exudative age-related macular degeneration, left eye, with active choroidal neovascularization: Secondary | ICD-10-CM | POA: Diagnosis not present

## 2021-12-07 DIAGNOSIS — H353213 Exudative age-related macular degeneration, right eye, with inactive scar: Secondary | ICD-10-CM

## 2021-12-07 NOTE — Assessment & Plan Note (Addendum)
Inactive large subfoveal disciform scar stable, , no therapy available ?

## 2021-12-07 NOTE — Patient Instructions (Signed)
Patient no longer needs to use oral AREDS therapy as the intended benefit is no longer appropriate ?

## 2021-12-07 NOTE — Progress Notes (Signed)
? ? ?12/07/2021 ? ?  ? ?CHIEF COMPLAINT ?Patient presents for  ?Chief Complaint  ?Patient presents with  ? Macular Degeneration  ? ? ? ? ?HISTORY OF PRESENT ILLNESS: ?Jimmy Butler is a 88 y.o. male who presents to the clinic today for:  ? ? ? ?Referring physician: ?Josetta Huddle, MD ?[place] ?[address] ?[place],  [place] [address] ? ?HISTORICAL INFORMATION:  ? ?Selected notes from the [facility] ?  ? ?Lab Results  ?Component Value Date  ? HGBA1C 6.5 (H) 03/01/2021  ?  ? ?CURRENT MEDICATIONS: ?No current outpatient medications on file. (Ophthalmic Drugs)  ? ?No current facility-administered medications for this visit. (Ophthalmic Drugs)  ? ?Current Outpatient Medications (Other)  ?Medication Sig  ? amLODipine (NORVASC) 10 MG tablet Take 1 tablet by mouth daily.  ? aspirin 81 MG EC tablet Take 81 mg by mouth daily.  ? atorvastatin (LIPITOR) 40 MG tablet Take 1 tablet by mouth daily.  ? benzonatate (TESSALON) 100 MG capsule Take 100 mg by mouth 3 (three) times daily as needed for cough.  ? carvedilol (COREG) 6.25 MG tablet Take 6.25 mg by mouth 2 (two) times daily.  ? chlorthalidone (HYGROTON) 25 MG tablet Take 25 mg by mouth daily.  ? glimepiride (AMARYL) 2 MG tablet Take 2 mg by mouth daily.  ? metFORMIN (GLUCOPHAGE-XR) 500 MG 24 hr tablet Take 500 mg by mouth daily.  ? telmisartan (MICARDIS) 80 MG tablet Take 80 mg by mouth daily.  ? TRULICITY 9.83 JA/2.5KN SOPN Inject 0.75 mg into the skin once a week.  ? vitamin B-12 (CYANOCOBALAMIN) 1000 MCG tablet Take 1,000 mcg by mouth daily.    ? ?No current facility-administered medications for this visit. (Other)  ? ? ? ? ?REVIEW OF SYSTEMS: ?ROS   ?Negative for: Constitutional, Gastrointestinal, Neurological, Skin, Genitourinary, Musculoskeletal, HENT, Endocrine, Cardiovascular, Eyes, Respiratory, Psychiatric, Allergic/Imm, Heme/Lymph ?Last edited by Hurman Horn, MD on 12/07/2021 10:58 AM.  ?  ? ? ? ?ALLERGIES ?No Known Allergies ? ?PAST MEDICAL  HISTORY ?Past Medical History:  ?Diagnosis Date  ? Diabetes mellitus without complication ([place])   ? ?No past surgical history on file. ? ?FAMILY HISTORY ?Family History  ?Problem Relation Age of Onset  ? Heart disease Father   ? ? ?SOCIAL HISTORY ?Social History  ? ?Tobacco Use  ? Smoking status: Former  ?  Packs/day: 1.50  ?  Years: 40.00  ?  Pack years: 60.00  ?  Types: Cigarettes  ?  Quit date: 09/19/1968  ?  Years since quitting: 53.2  ? Smokeless tobacco: Never  ?Substance Use Topics  ? Alcohol use: No  ? Drug use: No  ? ?  ? ?  ? ?OPHTHALMIC EXAM: ? ?Base Eye Exam   ? ? Visual Acuity (ETDRS)   ? ?   Right Left  ? Dist [place] CF at 3' 20/400  ? ?  ?  ? ? Tonometry (Tonopen, 10:59 AM)   ? ?   Right Left  ? Pressure 8 8  ? ?  ?  ? ? Pupils   ? ?   Pupils  ? Right PERRL  ? Left PERRL  ? ?  ?  ? ? Visual Fields   ? ?   Left Right  ? Restrictions Partial inner superior temporal, inferior temporal, superior nasal, inferior nasal deficiencies Partial inner superior temporal, inferior temporal, superior nasal, inferior nasal deficiencies  ? ?  ?  ? ? Extraocular Movement   ? ?  Right Left  ?  Full, Ortho Full, Ortho  ? ?  ?  ? ? Neuro/Psych   ? ? Oriented x3: Yes  ? ?  ?  ? ? Dilation   ? ? Both eyes: 1.0% Mydriacyl, 2.5% Phenylephrine @ 11:40 AM  ? ?  ?  ? ?  ? ?Slit Lamp and Fundus Exam   ? ? External Exam   ? ?   Right Left  ? External Normal Normal  ? ?  ?  ? ? Slit Lamp Exam   ? ?   Right Left  ? Lids/Lashes Normal Normal  ? Conjunctiva/Sclera White and quiet White and quiet  ? Cornea Clear Clear  ? Anterior Chamber Deep and quiet Deep and quiet  ? Iris Round and reactive Round and reactive  ? Lens Posterior chamber intraocular lens Posterior chamber intraocular lens  ? Anterior Vitreous Normal Normal  ? ?  ?  ? ? Fundus Exam   ? ?   Right Left  ? Posterior Vitreous Posterior vitreous detachment Posterior vitreous detachment  ? Disc Normal Normal  ? C/D Ratio 0.25 0.2  ? Macula Disciform scar 22 da size, no hemorrhage,  Subretinal fibrosis, 12-15 disc areas in size centrally no active edges, no macular thickening, large area of geographic atrophy in the macula Drusen, Disciform scar large 30 DA size or larger, large with pigmentary ring.  No active subretinal hemorrhage or fluid peripherally.  No signs of active disease  ? Vessels Normal Normal  ? Periphery Normal Normal  ? ?  ?  ? ?  ? ? ?IMAGING AND PROCEDURES  ?Imaging and Procedures for 12/07/21 ? ?OCT, Retina - OU - Both Eyes   ? ?   ?Right Eye ?Quality was good. Scan locations included subfoveal. Central Foveal Thickness: 366. Progression has been stable. Findings include abnormal foveal contour, disciform scar, no IRF, inner retinal atrophy, central retinal atrophy, subretinal scarring, outer retinal atrophy.  ? ?Left Eye ?Quality was good. Scan locations included subfoveal. Central Foveal Thickness: 416. Progression has been stable. Findings include abnormal foveal contour, no SRF, disciform scar, outer retinal atrophy, subretinal scarring.  ? ?Notes ?OS with fewer active edges to the lesion.  13 months of therapy OS ? ?No signs of recurrence of CNVM OU, will follow-up ?Over 1 year ? ?  ? ? ?  ?  ? ?  ?ASSESSMENT/PLAN: ? ?Exudative age-related macular degeneration of left eye with inactive choroidal neovascularization ([place]) ?Inactive large subfoveal disciform scar stable, , no therapy available ? ?Exudative age-related macular degeneration of right eye with inactive scar ([place]) ?Inactive massive subfoveal disciform scar no active edges observe  ? ?  ICD-10-CM   ?1. Exudative age-related macular degeneration of left eye with active choroidal neovascularization (HCC)  H35.3221 OCT, Retina - OU - Both Eyes  ?  ?2. Exudative age-related macular degeneration of right eye with inactive scar ([place])  H35.3213 OCT, Retina - OU - Both Eyes  ?  ?3. Exudative age-related macular degeneration of left eye with inactive choroidal neovascularization ([place])  H35.3222   ?  ? ? ?1.  OU with  disciform scar massive. ? ?2.  No active edges to the lesion.  Patient doing very well with ambulatory capability as well as amazingly positive attitude ? ?3. ? ?Ophthalmic Meds Ordered this visit:  ?No orders of the defined types were placed in this encounter. ? ? ?  ? ?Return in about 18 months (around 06/10/2023) for DILATE OU, COLOR FP,  OCT. ? ?There are no Patient Instructions on file for this visit. ? ? ?Explained the diagnoses, plan, and follow up with the patient and they expressed understanding.  Patient expressed understanding of the importance of proper follow up care.  ? ?Clent Demark. Roxie Gueye M.D. ?Diseases & Surgery of the Retina and Vitreous ?[facility] ?12/07/21 ? ? ? ? ?Abbreviations: ?M myopia (nearsighted); A astigmatism; H hyperopia (farsighted); P presbyopia; Mrx spectacle prescription;  CTL contact lenses; OD right eye; OS left eye; OU both eyes  XT exotropia; ET esotropia; PEK punctate epithelial keratitis; PEE punctate epithelial erosions; DES dry eye syndrome; MGD meibomian gland dysfunction; ATs artificial tears; PFAT's preservative free artificial tears; [place] nuclear sclerotic cataract; PSC posterior subcapsular cataract; ERM epi-retinal membrane; PVD posterior vitreous detachment; RD retinal detachment; DM diabetes mellitus; DR diabetic retinopathy; NPDR non-proliferative diabetic retinopathy; PDR proliferative diabetic retinopathy; CSME clinically significant macular edema; DME diabetic macular edema; dbh dot blot hemorrhages; CWS cotton wool spot; POAG primary open angle glaucoma; C/D cup-to-disc ratio; HVF humphrey visual field; GVF goldmann visual field; OCT optical coherence tomography; IOP intraocular pressure; BRVO Branch retinal vein occlusion; CRVO central retinal vein occlusion; CRAO central retinal artery occlusion; BRAO branch retinal artery occlusion; RT retinal tear; SB scleral buckle; PPV pars plana vitrectomy; VH Vitreous hemorrhage; PRP panretinal laser  photocoagulation; IVK intravitreal kenalog; VMT vitreomacular traction; MH Macular hole;  NVD neovascularization of the disc; NVE neovascularization elsewhere; AREDS age related eye disease study; ARMD age related macular

## 2021-12-07 NOTE — Assessment & Plan Note (Signed)
Inactive massive subfoveal disciform scar no active edges observe ?

## 2021-12-14 ENCOUNTER — Encounter: Payer: Self-pay | Admitting: Internal Medicine

## 2021-12-14 DIAGNOSIS — G4733 Obstructive sleep apnea (adult) (pediatric): Secondary | ICD-10-CM | POA: Diagnosis not present

## 2021-12-16 DIAGNOSIS — Z1389 Encounter for screening for other disorder: Secondary | ICD-10-CM | POA: Diagnosis not present

## 2021-12-16 DIAGNOSIS — K5900 Constipation, unspecified: Secondary | ICD-10-CM | POA: Diagnosis not present

## 2021-12-16 DIAGNOSIS — H353230 Exudative age-related macular degeneration, bilateral, stage unspecified: Secondary | ICD-10-CM | POA: Diagnosis not present

## 2021-12-16 DIAGNOSIS — E1142 Type 2 diabetes mellitus with diabetic polyneuropathy: Secondary | ICD-10-CM | POA: Diagnosis not present

## 2021-12-16 DIAGNOSIS — G4733 Obstructive sleep apnea (adult) (pediatric): Secondary | ICD-10-CM | POA: Diagnosis not present

## 2021-12-16 DIAGNOSIS — E1122 Type 2 diabetes mellitus with diabetic chronic kidney disease: Secondary | ICD-10-CM | POA: Diagnosis not present

## 2021-12-16 DIAGNOSIS — Z0000 Encounter for general adult medical examination without abnormal findings: Secondary | ICD-10-CM | POA: Diagnosis not present

## 2021-12-16 DIAGNOSIS — I129 Hypertensive chronic kidney disease with stage 1 through stage 4 chronic kidney disease, or unspecified chronic kidney disease: Secondary | ICD-10-CM | POA: Diagnosis not present

## 2021-12-16 DIAGNOSIS — Z6837 Body mass index (BMI) 37.0-37.9, adult: Secondary | ICD-10-CM | POA: Diagnosis not present

## 2021-12-16 DIAGNOSIS — E7800 Pure hypercholesterolemia, unspecified: Secondary | ICD-10-CM | POA: Diagnosis not present

## 2021-12-16 DIAGNOSIS — N1832 Chronic kidney disease, stage 3b: Secondary | ICD-10-CM | POA: Diagnosis not present

## 2021-12-17 DIAGNOSIS — I129 Hypertensive chronic kidney disease with stage 1 through stage 4 chronic kidney disease, or unspecified chronic kidney disease: Secondary | ICD-10-CM | POA: Diagnosis not present

## 2022-01-16 DIAGNOSIS — I129 Hypertensive chronic kidney disease with stage 1 through stage 4 chronic kidney disease, or unspecified chronic kidney disease: Secondary | ICD-10-CM | POA: Diagnosis not present

## 2022-02-16 DIAGNOSIS — I10 Essential (primary) hypertension: Secondary | ICD-10-CM | POA: Diagnosis not present

## 2022-03-23 DIAGNOSIS — G4733 Obstructive sleep apnea (adult) (pediatric): Secondary | ICD-10-CM | POA: Diagnosis not present

## 2022-04-22 DIAGNOSIS — G4733 Obstructive sleep apnea (adult) (pediatric): Secondary | ICD-10-CM | POA: Diagnosis not present

## 2022-06-16 DIAGNOSIS — I129 Hypertensive chronic kidney disease with stage 1 through stage 4 chronic kidney disease, or unspecified chronic kidney disease: Secondary | ICD-10-CM | POA: Diagnosis not present

## 2022-06-16 DIAGNOSIS — Z23 Encounter for immunization: Secondary | ICD-10-CM | POA: Diagnosis not present

## 2022-06-16 DIAGNOSIS — I2510 Atherosclerotic heart disease of native coronary artery without angina pectoris: Secondary | ICD-10-CM | POA: Diagnosis not present

## 2022-06-16 DIAGNOSIS — G4733 Obstructive sleep apnea (adult) (pediatric): Secondary | ICD-10-CM | POA: Diagnosis not present

## 2022-06-16 DIAGNOSIS — N1832 Chronic kidney disease, stage 3b: Secondary | ICD-10-CM | POA: Diagnosis not present

## 2022-06-16 DIAGNOSIS — E1142 Type 2 diabetes mellitus with diabetic polyneuropathy: Secondary | ICD-10-CM | POA: Diagnosis not present

## 2022-06-16 DIAGNOSIS — E1122 Type 2 diabetes mellitus with diabetic chronic kidney disease: Secondary | ICD-10-CM | POA: Diagnosis not present

## 2022-06-21 DIAGNOSIS — G4733 Obstructive sleep apnea (adult) (pediatric): Secondary | ICD-10-CM | POA: Diagnosis not present

## 2022-07-21 DIAGNOSIS — G4733 Obstructive sleep apnea (adult) (pediatric): Secondary | ICD-10-CM | POA: Diagnosis not present

## 2022-08-23 DIAGNOSIS — G4733 Obstructive sleep apnea (adult) (pediatric): Secondary | ICD-10-CM | POA: Diagnosis not present

## 2022-09-22 DIAGNOSIS — G4733 Obstructive sleep apnea (adult) (pediatric): Secondary | ICD-10-CM | POA: Diagnosis not present

## 2022-10-24 DIAGNOSIS — G4733 Obstructive sleep apnea (adult) (pediatric): Secondary | ICD-10-CM | POA: Diagnosis not present

## 2022-12-21 ENCOUNTER — Encounter (INDEPENDENT_AMBULATORY_CARE_PROVIDER_SITE_OTHER): Payer: PPO | Admitting: Ophthalmology

## 2023-04-19 ENCOUNTER — Other Ambulatory Visit (HOSPITAL_COMMUNITY): Payer: Self-pay

## 2023-04-19 MED ORDER — CARVEDILOL 12.5 MG PO TABS
12.5000 mg | ORAL_TABLET | Freq: Two times a day (BID) | ORAL | 2 refills | Status: DC
  Filled 2023-05-06 – 2023-05-27 (×7): qty 180, 90d supply, fill #0
  Filled 2023-08-21: qty 180, 90d supply, fill #1
  Filled 2023-11-18: qty 180, 90d supply, fill #2

## 2023-04-19 MED ORDER — ATORVASTATIN CALCIUM 40 MG PO TABS
40.0000 mg | ORAL_TABLET | Freq: Every day | ORAL | 3 refills | Status: DC
  Filled 2023-05-06 – 2023-05-08 (×2): qty 90, 90d supply, fill #0
  Filled 2023-07-31: qty 90, 90d supply, fill #1
  Filled 2024-01-26: qty 90, 90d supply, fill #3

## 2023-04-19 MED ORDER — METFORMIN HCL ER 500 MG PO TB24
500.0000 mg | ORAL_TABLET | Freq: Every evening | ORAL | 2 refills | Status: DC
  Filled 2023-04-19: qty 90, 90d supply, fill #0
  Filled 2023-06-23: qty 90, 90d supply, fill #1
  Filled 2023-09-08: qty 90, 90d supply, fill #2

## 2023-04-19 MED ORDER — CHLORTHALIDONE 25 MG PO TABS
12.5000 mg | ORAL_TABLET | Freq: Every day | ORAL | 2 refills | Status: DC
  Filled 2023-05-05 – 2023-05-22 (×4): qty 45, 90d supply, fill #0
  Filled 2023-08-14: qty 45, 90d supply, fill #1
  Filled 2023-11-13: qty 45, 90d supply, fill #2

## 2023-04-19 MED ORDER — TELMISARTAN 80 MG PO TABS
80.0000 mg | ORAL_TABLET | Freq: Every day | ORAL | 2 refills | Status: DC
  Filled 2023-04-19: qty 90, 90d supply, fill #0
  Filled 2023-07-12: qty 90, 90d supply, fill #1
  Filled 2023-10-10: qty 90, 90d supply, fill #2

## 2023-04-19 MED ORDER — AMLODIPINE BESYLATE 10 MG PO TABS
10.0000 mg | ORAL_TABLET | Freq: Every day | ORAL | 2 refills | Status: DC
  Filled 2023-04-19 – 2023-05-22 (×4): qty 90, 90d supply, fill #0
  Filled 2023-08-14: qty 90, 90d supply, fill #1
  Filled 2023-11-13: qty 90, 90d supply, fill #2

## 2023-04-20 ENCOUNTER — Other Ambulatory Visit: Payer: Self-pay

## 2023-05-05 ENCOUNTER — Other Ambulatory Visit: Payer: Self-pay

## 2023-05-05 ENCOUNTER — Other Ambulatory Visit (HOSPITAL_COMMUNITY): Payer: Self-pay

## 2023-05-06 ENCOUNTER — Other Ambulatory Visit (HOSPITAL_COMMUNITY): Payer: Self-pay

## 2023-05-08 ENCOUNTER — Other Ambulatory Visit: Payer: Self-pay

## 2023-05-15 ENCOUNTER — Other Ambulatory Visit (HOSPITAL_COMMUNITY): Payer: Self-pay

## 2023-05-22 ENCOUNTER — Other Ambulatory Visit (HOSPITAL_COMMUNITY): Payer: Self-pay

## 2023-05-23 ENCOUNTER — Other Ambulatory Visit: Payer: Self-pay

## 2023-05-24 ENCOUNTER — Other Ambulatory Visit (HOSPITAL_COMMUNITY): Payer: Self-pay

## 2023-05-24 ENCOUNTER — Other Ambulatory Visit: Payer: Self-pay

## 2023-05-25 ENCOUNTER — Other Ambulatory Visit (HOSPITAL_COMMUNITY): Payer: Self-pay

## 2023-05-26 ENCOUNTER — Other Ambulatory Visit: Payer: Self-pay

## 2023-05-26 ENCOUNTER — Other Ambulatory Visit (HOSPITAL_COMMUNITY): Payer: Self-pay

## 2023-05-27 ENCOUNTER — Other Ambulatory Visit (HOSPITAL_COMMUNITY): Payer: Self-pay

## 2023-06-23 ENCOUNTER — Other Ambulatory Visit (HOSPITAL_COMMUNITY): Payer: Self-pay

## 2023-09-08 ENCOUNTER — Other Ambulatory Visit: Payer: Self-pay

## 2023-11-18 ENCOUNTER — Other Ambulatory Visit (HOSPITAL_COMMUNITY): Payer: Self-pay

## 2023-12-18 ENCOUNTER — Other Ambulatory Visit (HOSPITAL_COMMUNITY): Payer: Self-pay

## 2023-12-18 ENCOUNTER — Other Ambulatory Visit: Payer: Self-pay

## 2023-12-18 MED ORDER — METFORMIN HCL ER 500 MG PO TB24
500.0000 mg | ORAL_TABLET | Freq: Every day | ORAL | 2 refills | Status: DC
  Filled 2023-12-18: qty 90, 90d supply, fill #0
  Filled 2024-03-11: qty 90, 90d supply, fill #1
  Filled 2024-06-09: qty 90, 90d supply, fill #2

## 2024-01-08 ENCOUNTER — Other Ambulatory Visit: Payer: Self-pay

## 2024-01-08 ENCOUNTER — Other Ambulatory Visit (HOSPITAL_COMMUNITY): Payer: Self-pay

## 2024-01-08 MED ORDER — TELMISARTAN 80 MG PO TABS
80.0000 mg | ORAL_TABLET | Freq: Every day | ORAL | 2 refills | Status: DC
  Filled 2024-01-08: qty 90, 90d supply, fill #0
  Filled 2024-04-01: qty 90, 90d supply, fill #1
  Filled 2024-06-30: qty 90, 90d supply, fill #2

## 2024-02-10 ENCOUNTER — Other Ambulatory Visit (HOSPITAL_COMMUNITY): Payer: Self-pay

## 2024-02-13 ENCOUNTER — Other Ambulatory Visit (HOSPITAL_COMMUNITY): Payer: Self-pay

## 2024-02-13 ENCOUNTER — Other Ambulatory Visit: Payer: Self-pay

## 2024-02-13 MED ORDER — AMLODIPINE BESYLATE 10 MG PO TABS
10.0000 mg | ORAL_TABLET | Freq: Every day | ORAL | 2 refills | Status: AC
  Filled 2024-02-13: qty 90, 90d supply, fill #0
  Filled 2024-05-07: qty 90, 90d supply, fill #1
  Filled 2024-08-05: qty 90, 90d supply, fill #2

## 2024-02-13 MED ORDER — CHLORTHALIDONE 25 MG PO TABS
12.5000 mg | ORAL_TABLET | Freq: Every day | ORAL | 2 refills | Status: AC
  Filled 2024-02-13: qty 45, 90d supply, fill #0
  Filled 2024-05-07: qty 45, 90d supply, fill #1
  Filled 2024-08-05: qty 45, 90d supply, fill #2

## 2024-02-16 ENCOUNTER — Other Ambulatory Visit: Payer: Self-pay

## 2024-02-16 ENCOUNTER — Other Ambulatory Visit (HOSPITAL_COMMUNITY): Payer: Self-pay

## 2024-02-16 MED ORDER — CARVEDILOL 12.5 MG PO TABS
12.5000 mg | ORAL_TABLET | Freq: Two times a day (BID) | ORAL | 2 refills | Status: AC
  Filled 2024-02-16: qty 180, 90d supply, fill #0
  Filled 2024-05-11: qty 180, 90d supply, fill #1
  Filled 2024-08-09: qty 180, 90d supply, fill #2

## 2024-04-20 ENCOUNTER — Other Ambulatory Visit (HOSPITAL_COMMUNITY): Payer: Self-pay

## 2024-04-22 ENCOUNTER — Other Ambulatory Visit (HOSPITAL_COMMUNITY): Payer: Self-pay

## 2024-04-22 ENCOUNTER — Other Ambulatory Visit: Payer: Self-pay

## 2024-04-22 MED ORDER — ATORVASTATIN CALCIUM 40 MG PO TABS
40.0000 mg | ORAL_TABLET | Freq: Every day | ORAL | 2 refills | Status: AC
  Filled 2024-04-22: qty 90, 90d supply, fill #0
  Filled 2024-07-19: qty 90, 90d supply, fill #1
  Filled 2024-10-15: qty 90, 90d supply, fill #2

## 2024-07-01 ENCOUNTER — Other Ambulatory Visit (HOSPITAL_COMMUNITY): Payer: Self-pay

## 2024-08-05 ENCOUNTER — Other Ambulatory Visit (HOSPITAL_COMMUNITY): Payer: Self-pay

## 2024-08-05 MED ORDER — HYDROXYZINE HCL 25 MG PO TABS
12.5000 mg | ORAL_TABLET | Freq: Every day | ORAL | 3 refills | Status: AC | PRN
  Filled 2024-08-05: qty 30, 30d supply, fill #0

## 2024-08-05 MED ORDER — CETIRIZINE HCL 10 MG PO TABS
10.0000 mg | ORAL_TABLET | Freq: Every day | ORAL | 3 refills | Status: AC
  Filled 2024-08-05: qty 30, 30d supply, fill #0
  Filled 2024-08-29: qty 30, 30d supply, fill #1
  Filled 2024-09-28: qty 30, 30d supply, fill #2

## 2024-08-29 ENCOUNTER — Other Ambulatory Visit: Payer: Self-pay

## 2024-09-07 ENCOUNTER — Other Ambulatory Visit (HOSPITAL_COMMUNITY): Payer: Self-pay

## 2024-09-08 ENCOUNTER — Other Ambulatory Visit (HOSPITAL_COMMUNITY): Payer: Self-pay

## 2024-09-08 MED ORDER — METFORMIN HCL ER 500 MG PO TB24
500.0000 mg | ORAL_TABLET | Freq: Every day | ORAL | 2 refills | Status: AC
  Filled 2024-09-08: qty 90, 90d supply, fill #0

## 2024-09-09 ENCOUNTER — Other Ambulatory Visit: Payer: Self-pay

## 2024-09-28 ENCOUNTER — Other Ambulatory Visit (HOSPITAL_COMMUNITY): Payer: Self-pay

## 2024-09-29 ENCOUNTER — Other Ambulatory Visit: Payer: Self-pay

## 2024-09-30 ENCOUNTER — Other Ambulatory Visit: Payer: Self-pay

## 2024-09-30 ENCOUNTER — Other Ambulatory Visit (HOSPITAL_COMMUNITY): Payer: Self-pay

## 2024-09-30 ENCOUNTER — Other Ambulatory Visit (HOSPITAL_BASED_OUTPATIENT_CLINIC_OR_DEPARTMENT_OTHER): Payer: Self-pay

## 2024-09-30 MED ORDER — TELMISARTAN 80 MG PO TABS
80.0000 mg | ORAL_TABLET | Freq: Every day | ORAL | 2 refills | Status: AC
  Filled 2024-09-30: qty 90, 90d supply, fill #0

## 2024-10-03 ENCOUNTER — Other Ambulatory Visit: Payer: Self-pay
